# Patient Record
Sex: Female | Born: 1960 | Race: White | Hispanic: No | Marital: Married | Smoking: Former smoker
Health system: Southern US, Community
[De-identification: ages and names within clinical notes are randomized; demographics above are authoritative.]

## PROBLEM LIST (undated history)

## (undated) DIAGNOSIS — R569 Unspecified convulsions: Secondary | ICD-10-CM

## (undated) DIAGNOSIS — E7800 Pure hypercholesterolemia, unspecified: Secondary | ICD-10-CM

## (undated) DIAGNOSIS — Z8489 Family history of other specified conditions: Secondary | ICD-10-CM

## (undated) DIAGNOSIS — Z789 Other specified health status: Secondary | ICD-10-CM

## (undated) DIAGNOSIS — S83209A Unspecified tear of unspecified meniscus, current injury, unspecified knee, initial encounter: Secondary | ICD-10-CM

## (undated) DIAGNOSIS — E559 Vitamin D deficiency, unspecified: Secondary | ICD-10-CM

## (undated) HISTORY — PX: ABDOMINAL HYSTERECTOMY: SHX81

## (undated) HISTORY — DX: Pure hypercholesterolemia, unspecified: E78.00

## (undated) HISTORY — DX: Unspecified convulsions: R56.9

## (undated) HISTORY — PX: KNEE ARTHROSCOPY: SUR90

## (undated) HISTORY — PX: TUBAL LIGATION: SHX77

## (undated) HISTORY — PX: OTHER SURGICAL HISTORY: SHX169

---
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
## 2016-04-29 HISTORY — PX: OTHER SURGICAL HISTORY: SHX169

## 2017-04-29 HISTORY — PX: OTHER SURGICAL HISTORY: SHX169

## 2018-11-26 ENCOUNTER — Encounter: Payer: Self-pay | Admitting: Osteopathic Medicine

## 2018-11-26 ENCOUNTER — Other Ambulatory Visit: Payer: Self-pay

## 2018-11-26 ENCOUNTER — Ambulatory Visit (INDEPENDENT_AMBULATORY_CARE_PROVIDER_SITE_OTHER): Payer: No Typology Code available for payment source | Admitting: Osteopathic Medicine

## 2018-11-26 VITALS — BP 122/78 | HR 94 | Ht 64.0 in | Wt 209.0 lb

## 2018-11-26 DIAGNOSIS — Z860100 Personal history of colon polyps, unspecified: Secondary | ICD-10-CM

## 2018-11-26 DIAGNOSIS — E782 Mixed hyperlipidemia: Secondary | ICD-10-CM | POA: Diagnosis not present

## 2018-11-26 DIAGNOSIS — G40909 Epilepsy, unspecified, not intractable, without status epilepticus: Secondary | ICD-10-CM

## 2018-11-26 DIAGNOSIS — E559 Vitamin D deficiency, unspecified: Secondary | ICD-10-CM

## 2018-11-26 DIAGNOSIS — M25561 Pain in right knee: Secondary | ICD-10-CM

## 2018-11-26 DIAGNOSIS — Z90710 Acquired absence of both cervix and uterus: Secondary | ICD-10-CM | POA: Diagnosis not present

## 2018-11-26 DIAGNOSIS — M25562 Pain in left knee: Secondary | ICD-10-CM

## 2018-11-26 DIAGNOSIS — Z803 Family history of malignant neoplasm of breast: Secondary | ICD-10-CM

## 2018-11-26 MED ORDER — SIMVASTATIN 10 MG PO TABS: 10.0000 mg | ORAL_TABLET | Freq: Every day | ORAL | 3 refills | Status: DC

## 2018-11-26 NOTE — Patient Instructions (Signed)
Weight loss: important things to remember  It is hard work! You will have setbacks, but don't get discouraged. The goal is not short-term success, it is long-term health.   Looking at the numbers is important to track your progress and set goals, but how you are feeling and your overall health are the most important things! BMI and pounds and calories and miles logged aren't everything - they are tools to help [place] reach your goals.  You can do this!!!   Things to remember for exercise for weight loss:   Please note - I am not a certified personal trainer. I can present you with ideas and general workout goals, but an exercise program is largely up to you. Find something you can stick with, and something you enjoy!   As you progress in your exercise regimen think about gradually increasing the following, week by week:   intensity (how strenuous is your workout)  frequency (how often you are exercising)  duration (how many minutes at a time you are exercising)  Walking for 20 minutes a day is certainly better than nothing, but more strenuous exercise will develop better cardiovascular fitness.   interval training (high-intensity alternating with low-intensity, think walk/jog rather than just walk)  muscle strengthening exercises (weight lifting, calisthenics, yoga) - this also helps prevent osteoporosis!   Things to remember for diet changes for weight loss:   Please note - I am not a certified dietician. I can present you with ideas and general diet goals, but a meal plan is largely up to you. I am happy to refer you to a dietician who can give you a detailed meal plan.  Apps/logs are crucial to track how you're eating! It's not realistic to be logging everything you eat forever, but when you're starting a healthy eating lifestyle it's very helpful, and checking in with logs now and then helps you stick to your program!   Calorie restriction with the goal weight loss of no more than one  to one and a half pounds per week.   Increase lean protein such as chicken, fish, [place].   Decrease fatty foods such as dairy, butter.   Decrease sugary foods. Avoid sugary drinks such as soda or juice.  Increase fiber found in fruit and vegetables.

## 2018-11-26 NOTE — Progress Notes (Signed)
HPI: Kelsey Matthews is a 58 y.o. female who  has no past medical history on file.  she presents to [facility] today, 11/26/18,  for chief complaint of: New to establish care  High cholesterol History of epilepsy  Very pleasant new patient here to establish care.  I see both of her parents as well.  Family recently moved down here from [place], patient reports adjusting pretty well and no major concerns.  Patient is married, has 2 children.  Has been about a year since any blood work or medication refills, requesting cholesterol check as well as vitamin D for history of deficiency.  She is taking simvastatin and Depakote and doing well on these medications for many years.  Status post hysterectomy for benign disease.  Family history of breast cancer, patient's mammograms have always been normal.  History of epilepsy, no seizures for many years.  Has been stable on Depakote 500 mg twice daily, requests referral to local neurologist for follow-up.  Postmenopausal, occasional hot flashes for the past 5 years, nothing that she feels the need to be on hormone replacement therapy for.  Weight gain is a bit of a concern for her, especially after menopause.  She has been working on diet.  Exercise is difficult due to orthopedic issues with knee pain.     Past medical, surgical, social and family history reviewed:  Patient Active Problem List   Diagnosis Date Noted  . Mixed hyperlipidemia 11/26/2018  . Nonintractable epilepsy without status epilepticus ([place]) 11/26/2018  . Vitamin D deficiency 11/26/2018  . History of hysterectomy 11/26/2018  . Family history of breast cancer 11/26/2018  . History of colon polyps 11/26/2018  . Pain in both knees 11/26/2018       Social History   Tobacco Use  . Smoking status: Former Research scientist (life sciences)  . Smokeless tobacco: Never Used  Substance Use Topics  . Alcohol use: Not on file    Family History  Problem Relation  Age of Onset  . Cancer Mother        breast, thyroid  . Diabetes Father   . Hypertension Father      Current medication list and allergy/intolerance information reviewed:    Current Outpatient Medications  Medication Sig Dispense Refill  . divalproex (DEPAKOTE) 500 MG DR tablet Take 500 mg by mouth 2 (two) times daily.    . simvastatin (ZOCOR) 10 MG tablet Take 1 tablet (10 mg total) by mouth daily. 90 tablet 3   No current facility-administered medications for this visit.     Allergies  Allergen Reactions  . Codeine       Review of Systems:  Constitutional:  No  fever, no chills, No recent illness, +unintentional weight changes. No significant fatigue.   HEENT: No  headache, no vision change, no hearing change, No sore throat, No  sinus pressure  Cardiac: No  chest pain, No  pressure, No palpitations, No  Orthopnea  Respiratory:  No  shortness of breath. No  Cough  Gastrointestinal: No  abdominal pain, No  nausea, No  vomiting,  No  blood in stool, No  diarrhea, No  constipation   Musculoskeletal: No new myalgia/arthralgia  Skin: No  Rash, No other wounds/concerning lesions  Genitourinary: No  incontinence, No  abnormal genital bleeding, No abnormal genital discharge  Hem/Onc: No  easy bruising/bleeding, No  abnormal lymph node  Endocrine: No cold intolerance,  +heat intolerance. No polyuria/polydipsia/polyphagia   Neurologic: No  weakness, No  dizziness,  No  slurred speech/focal weakness/facial droop  Psychiatric: No  concerns with depression, No  concerns with anxiety, No sleep problems, No mood problems  Exam:  BP 122/78   Pulse 94   Ht 5\' 4"  (1.626 m)   Wt 209 lb (94.8 kg)   BMI 35.87 kg/m   Constitutional: VS see above. General Appearance: alert, well-developed, well-nourished, NAD  Eyes: Normal lids and conjunctive, non-icteric sclera  Neck: No masses, trachea midline. No thyroid enlargement. No tenderness/mass appreciated. No  lymphadenopathy  Respiratory: Normal respiratory effort. no wheeze, no rhonchi, no rales  Cardiovascular: S1/S2 normal, no murmur, no rub/gallop auscultated. RRR. No lower extremity edema.  Gastrointestinal: Nontender, no masses. No hepatomegaly, no splenomegaly. No hernia appreciated. Bowel sounds normal. Rectal exam deferred.   Musculoskeletal: Gait normal. No clubbing/cyanosis of digits.   Neurological: Normal balance/coordination. No tremor. No cranial nerve deficit on limited exam. Motor and sensation intact and symmetric. Cerebellar reflexes intact.   Skin: warm, dry, intact. No rash/ulcer. No concerning nevi or subq nodules on limited exam.    Psychiatric: Normal judgment/insight. Normal mood and affect. Oriented x3.    No results found for this or any previous visit (from the past 72 hour(s)).  No results found.        ASSESSMENT/PLAN: The primary encounter diagnosis was Mixed hyperlipidemia. Diagnoses of Nonintractable epilepsy without status epilepticus, unspecified epilepsy type ([place]), Vitamin D deficiency, History of hysterectomy, Family history of breast cancer, History of colon polyps, and Pain in both knees, unspecified chronicity were also pertinent to this visit.   Orders Placed This Encounter  Procedures  . CBC  . COMPLETE METABOLIC PANEL WITH GFR  . Lipid panel  . TSH  . Valproic Acid level  . Ambulatory referral to Neurology    Meds ordered this encounter  Medications  . simvastatin (ZOCOR) 10 MG tablet    Sig: Take 1 tablet (10 mg total) by mouth daily.    Dispense:  90 tablet    Refill:  3    Patient Instructions  Weight loss: important things to remember  It is hard work! You will have setbacks, but don't get discouraged. The goal is not short-term success, it is long-term health.   Looking at the numbers is important to track your progress and set goals, but how you are feeling and your overall health are the most important things! BMI and  pounds and calories and miles logged aren't everything - they are tools to help [place] reach your goals.  You can do this!!!   Things to remember for exercise for weight loss:   Please note - I am not a certified personal trainer. I can present you with ideas and general workout goals, but an exercise program is largely up to you. Find something you can stick with, and something you enjoy!   As you progress in your exercise regimen think about gradually increasing the following, week by week:   intensity (how strenuous is your workout)  frequency (how often you are exercising)  duration (how many minutes at a time you are exercising)  Walking for 20 minutes a day is certainly better than nothing, but more strenuous exercise will develop better cardiovascular fitness.   interval training (high-intensity alternating with low-intensity, think walk/jog rather than just walk)  muscle strengthening exercises (weight lifting, calisthenics, yoga) - this also helps prevent osteoporosis!   Things to remember for diet changes for weight loss:   Please note - I am not a certified dietician.  I can present you with ideas and general diet goals, but a meal plan is largely up to you. I am happy to refer you to a dietician who can give you a detailed meal plan.  Apps/logs are crucial to track how you're eating! It's not realistic to be logging everything you eat forever, but when you're starting a healthy eating lifestyle it's very helpful, and checking in with logs now and then helps you stick to your program!   Calorie restriction with the goal weight loss of no more than one to one and a half pounds per week.   Increase lean protein such as chicken, fish, [place].   Decrease fatty foods such as dairy, butter.   Decrease sugary foods. Avoid sugary drinks such as soda or juice.  Increase fiber found in fruit and vegetables.         Visit summary with medication list and pertinent instructions  was printed for patient to review. All questions at time of visit were answered - patient instructed to contact office with any additional concerns or updates. ER/RTC precautions were reviewed with the patient.     Please note: voice recognition software was used to produce this document, and typos may escape review. Please contact Dr. Sheppard Coil for any needed clarifications.     Follow-up plan: Return in about 6 months (around 05/29/2019) for [place] (call week prior to visit for lab orders).

## 2018-12-04 ENCOUNTER — Telehealth: Payer: Self-pay

## 2018-12-04 NOTE — Telephone Encounter (Signed)
I called pt and left her a VM stating she could call the number on the back of her insurance card to see if our office is in network

## 2018-12-04 NOTE — Telephone Encounter (Signed)
Pt left a vm msg - as per pt, she has new insurance coverage. Wants to make sure that plan is accepted at the clinic. Pls contact pt for verification of new insurance. Thanks.

## 2019-01-09 LAB — CBC
HCT: 39.5 % (ref 35.0–45.0)
Hemoglobin: 12.9 g/dL (ref 11.7–15.5)
MCH: 28.4 pg (ref 27.0–33.0)
MCHC: 32.7 g/dL (ref 32.0–36.0)
MCV: 86.8 fL (ref 80.0–100.0)
MPV: 10.9 fL (ref 7.5–12.5)
Platelets: 281 10*3/uL (ref 140–400)
RBC: 4.55 10*6/uL (ref 3.80–5.10)
RDW: 14.3 % (ref 11.0–15.0)
WBC: 6.3 10*3/uL (ref 3.8–10.8)

## 2019-01-09 LAB — LIPID PANEL
Cholesterol: 198 mg/dL (ref <200)
HDL: 43 mg/dL — ABNORMAL LOW (ref >=50)
LDL Cholesterol (Calc): 123 mg/dL (calc) — ABNORMAL HIGH
Non-HDL Cholesterol (Calc): 155 mg/dL (calc) — ABNORMAL HIGH (ref <130)
Total CHOL/HDL Ratio: 4.6 (calc) (ref <5.0)
Triglycerides: 198 mg/dL — ABNORMAL HIGH (ref <150)

## 2019-01-09 LAB — COMPLETE METABOLIC PANEL WITH GFR
AG Ratio: 1.8 (calc) (ref 1.0–2.5)
ALT: 12 U/L (ref 6–29)
AST: 10 U/L (ref 10–35)
Albumin: 4.4 g/dL (ref 3.6–5.1)
Alkaline phosphatase (APISO): 72 U/L (ref 37–153)
BUN: 15 mg/dL (ref 7–25)
CO2: 26 mmol/L (ref 20–32)
Calcium: 10.3 mg/dL (ref 8.6–10.4)
Chloride: 99 mmol/L (ref 98–110)
Creat: 0.77 mg/dL (ref 0.50–1.05)
GFR, Est African American: 100 mL/min/{1.73_m2} (ref >=60)
GFR, Est Non African American: 86 mL/min/{1.73_m2} (ref >=60)
Globulin: 2.4 g/dL (calc) (ref 1.9–3.7)
Glucose, Bld: 144 mg/dL — ABNORMAL HIGH (ref 65–99)
Potassium: 5 mmol/L (ref 3.5–5.3)
Sodium: 137 mmol/L (ref 135–146)
Total Bilirubin: 0.4 mg/dL (ref 0.2–1.2)
Total Protein: 6.8 g/dL (ref 6.1–8.1)

## 2019-01-09 LAB — TSH: TSH: 1.74 mIU/L (ref 0.40–4.50)

## 2019-01-09 LAB — VALPROIC ACID LEVEL: Valproic Acid Lvl: 119.6 mg/L — ABNORMAL HIGH (ref 50.0–100.0)

## 2019-01-12 ENCOUNTER — Telehealth: Payer: Self-pay

## 2019-01-12 NOTE — Telephone Encounter (Signed)
Pt called requesting another Neurologist office. As per pt, Neurology office she was referred to is out of network. Pt did not specify a specific location. Thanks.

## 2019-01-13 NOTE — Telephone Encounter (Signed)
Pt called with a Neurologist office that is within her insurance network. Pt is requesting the referral be sent to Dr. Metta Clines located in [place], [phone]. Thanks.

## 2019-01-14 NOTE — Telephone Encounter (Signed)
Done

## 2019-01-15 ENCOUNTER — Other Ambulatory Visit: Payer: Self-pay | Admitting: Family Medicine

## 2019-01-15 ENCOUNTER — Encounter: Payer: Self-pay | Admitting: Neurology

## 2019-01-15 DIAGNOSIS — G40909 Epilepsy, unspecified, not intractable, without status epilepticus: Secondary | ICD-10-CM

## 2019-01-15 NOTE — Progress Notes (Signed)
Patient is requesting a new referral due to insurance.

## 2019-01-25 ENCOUNTER — Telehealth: Payer: Self-pay

## 2019-01-25 DIAGNOSIS — M25569 Pain in unspecified knee: Secondary | ICD-10-CM

## 2019-01-25 NOTE — Telephone Encounter (Signed)
Pt called - requesting an appt with Sports Medicine. As per pt, knee pain is getting worse. Having trouble moving around. Pt mentioned due to insurance she needs a referral for Sports Medicine before having her appt that way visit is covered by insurance. She has an appt scheduled tomorrow w/Dr. Jacklynn Bue. Pls advise, thanks.

## 2019-01-26 ENCOUNTER — Ambulatory Visit (INDEPENDENT_AMBULATORY_CARE_PROVIDER_SITE_OTHER): Payer: No Typology Code available for payment source | Admitting: Sports Medicine

## 2019-01-26 ENCOUNTER — Other Ambulatory Visit: Payer: Self-pay

## 2019-01-26 ENCOUNTER — Encounter: Payer: Self-pay | Admitting: Sports Medicine

## 2019-01-26 ENCOUNTER — Ambulatory Visit (INDEPENDENT_AMBULATORY_CARE_PROVIDER_SITE_OTHER): Payer: No Typology Code available for payment source

## 2019-01-26 DIAGNOSIS — M25561 Pain in right knee: Secondary | ICD-10-CM

## 2019-01-26 NOTE — Telephone Encounter (Signed)
Pt advised.

## 2019-01-26 NOTE — Telephone Encounter (Signed)
Referral to Dr. Dianah Field ordered

## 2019-01-26 NOTE — Progress Notes (Signed)
Subjective:    I'm seeing this patient as a consultation for: Dr. Emeterio Reeve  CC: Right knee pain  HPI: This is a very pleasant 58 year old female, for the past couple weeks she is had increasing pain in her right knee, with swelling, pain at the medial joint line worse with flexion, prolonged weightbearing, no mechanical symptoms, no buckling, no trauma.  I reviewed the past medical history, family history, social history, surgical history, and allergies today and no changes were needed.  Please see the problem list section below in epic for further details.  Past Medical History: Past Medical History:  Diagnosis Date  . No pertinent past medical history    Past Surgical History: Past Surgical History:  Procedure Laterality Date  . KNEE ARTHROSCOPY Left    Social History: Social History   Socioeconomic History  . Marital status: Married    Spouse name: Not on file  . Number of children: Not on file  . Years of education: Not on file  . Highest education level: Not on file  Occupational History  . Not on file  Social Needs  . Financial resource strain: Not on file  . Food insecurity    Worry: Not on file    Inability: Not on file  . Transportation needs    Medical: Not on file    Non-medical: Not on file  Tobacco Use  . Smoking status: Former Research scientist (life sciences)  . Smokeless tobacco: Never Used  Substance and Sexual Activity  . Alcohol use: Not on file  . Drug use: Not on file  . Sexual activity: Not on file  Lifestyle  . Physical activity    Days per week: Not on file    Minutes per session: Not on file  . Stress: Not on file  Relationships  . Social Herbalist on phone: Not on file    Gets together: Not on file    Attends religious service: Not on file    Active member of club or organization: Not on file    Attends meetings of clubs or organizations: Not on file    Relationship status: Not on file  Other Topics Concern  . Not on file  Social  History Narrative  . Not on file   Family History: Family History  Problem Relation Age of Onset  . Cancer Mother        breast, thyroid  . Diabetes Father   . Hypertension Father    Allergies: Allergies  Allergen Reactions  . Codeine    Medications: See med rec.  Review of Systems: No headache, visual changes, nausea, vomiting, diarrhea, constipation, dizziness, abdominal pain, skin rash, fevers, chills, night sweats, weight loss, swollen lymph nodes, body aches, joint swelling, muscle aches, chest pain, shortness of breath, mood changes, visual or auditory hallucinations.   Objective:   General: Well Developed, well nourished, and in no acute distress.  Neuro:  Extra-ocular muscles intact, able to move all 4 extremities, sensation grossly intact.  Deep tendon reflexes tested were normal. Psych: Alert and oriented, mood congruent with affect. ENT:  Ears and nose appear unremarkable.  Hearing grossly normal. Neck: Unremarkable overall appearance, trachea midline.  No visible thyroid enlargement. Eyes: Conjunctivae and lids appear unremarkable.  Pupils equal and round. Skin: Warm and dry, no rashes noted.  Cardiovascular: Pulses palpable, no extremity edema. Right knee: Swollen, visible effusion and palpable fluid wave. Tender at the medial joint line. ROM normal in flexion and extension and lower  leg rotation. Ligaments with solid consistent endpoints including ACL, PCL, LCL, MCL. Negative Mcmurray's and provocative meniscal tests. Non painful patellar compression. Patellar and quadriceps tendons unremarkable. Hamstring and quadriceps strength is normal.  Procedure: Real-time Ultrasound Guided  aspiration/injection of right knee Device: GE Logiq E  Verbal informed consent obtained.  Time-out conducted.  Noted no overlying erythema, induration, or other signs of local infection.  Skin prepped in a sterile fashion.  Local anesthesia: Topical Ethyl chloride.  With sterile  technique and under real time ultrasound guidance:  Using an 18-gauge needle aspirated approximately 14 cc of clear, straw-colored fluid, syringe switched and 1 cc Kenalog 40, 2 cc lidocaine, 2 cc bupivacaine injected easily Completed without difficulty  Pain immediately resolved suggesting accurate placement of the medication.  Advised to call if fevers/chills, erythema, induration, drainage, or persistent bleeding.  Images permanently stored and available for review in the ultrasound unit.  Impression: Technically successful ultrasound guided injection.  Impression and Recommendations:   This case required medical decision making of moderate complexity.  Right knee pain With an effusion, suspect meniscal tear and osteoarthritis. X-rays, aspiration and injection, physical therapy, return to see me in 6 weeks.   ___________________________________________ Gwen Her. Dianah Field, M.D., ABFM., CAQSM. Primary Care and Sports Medicine [facility]  Adjunct Professor of [place] of [facility] of Medicine

## 2019-01-26 NOTE — Assessment & Plan Note (Signed)
With an effusion, suspect meniscal tear and osteoarthritis. X-rays, aspiration and injection, physical therapy, return to see me in 6 weeks.

## 2019-02-01 ENCOUNTER — Ambulatory Visit: Payer: No Typology Code available for payment source | Admitting: Physical Therapy

## 2019-02-25 ENCOUNTER — Other Ambulatory Visit: Payer: Self-pay

## 2019-02-25 ENCOUNTER — Ambulatory Visit (INDEPENDENT_AMBULATORY_CARE_PROVIDER_SITE_OTHER): Payer: No Typology Code available for payment source | Admitting: Sports Medicine

## 2019-02-25 ENCOUNTER — Encounter: Payer: Self-pay | Admitting: Sports Medicine

## 2019-02-25 DIAGNOSIS — M25561 Pain in right knee: Secondary | ICD-10-CM

## 2019-02-25 NOTE — Assessment & Plan Note (Addendum)
Persistent right knee pain in spite of aspiration and injection, mostly at the medial joint line. She does have a history of a left partial meniscectomy. We suspect meniscal tear of the right knee, she has failed greater than 6 weeks of conservative measures, x-rays were unrevealing. Proceeding with MRI, she will use over-the-counter ibuprofen, return to see me to go over MRI results. 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

## 2019-02-25 NOTE — Progress Notes (Addendum)
Subjective:    CC: Follow-up  HPI: Kelsey Matthews returns, she is a pleasant 58 year old female here with right knee pain, only minimal arthritis on x-rays, we did an aspiration and injection, she still has significant discomfort at the medial joint line.  I reviewed the past medical history, family history, social history, surgical history, and allergies today and no changes were needed.  Please see the problem list section below in epic for further details.  Past Medical History: Past Medical History:  Diagnosis Date  . High cholesterol   . Seizure [facility])    Past Surgical History: Past Surgical History:  Procedure Laterality Date  . KNEE ARTHROSCOPY Left    Social History: Social History   Socioeconomic History  . Marital status: Married    Spouse name: Not on file  . Number of children: Not on file  . Years of education: Not on file  . Highest education level: Not on file  Occupational History  . Not on file  Social Needs  . Financial resource strain: Not on file  . Food insecurity    Worry: Not on file    Inability: Not on file  . Transportation needs    Medical: Not on file    Non-medical: Not on file  Tobacco Use  . Smoking status: Former Research scientist (life sciences)  . Smokeless tobacco: Never Used  Substance and Sexual Activity  . Alcohol use: Yes    Comment: Monthly  . Drug use: Never  . Sexual activity: Yes    Partners: Male  Lifestyle  . Physical activity    Days per week: Not on file    Minutes per session: Not on file  . Stress: Not on file  Relationships  . Social Herbalist on phone: Not on file    Gets together: Not on file    Attends religious service: Not on file    Active member of club or organization: Not on file    Attends meetings of clubs or organizations: Not on file    Relationship status: Not on file  Other Topics Concern  . Not on file  Social History Narrative   Left handed   Lives with husband in two story home   Completed HS. Cosmetology  school   Family History: Family History  Problem Relation Age of Onset  . Cancer Mother        breast, thyroid  . Heart disease Mother   . Diabetes Father   . Hypertension Father   . Bowel Disease Father    Allergies: Allergies  Allergen Reactions  . Codeine    Medications: See med rec.  Review of Systems: No fevers, chills, night sweats, weight loss, chest pain, or shortness of breath.   Objective:    General: Well Developed, well nourished, and in no acute distress.  Neuro: Alert and oriented x3, extra-ocular muscles intact, sensation grossly intact.  HEENT: Normocephalic, atraumatic, pupils equal round reactive to light, neck supple, no masses, no lymphadenopathy, thyroid nonpalpable.  Skin: Warm and dry, no rashes. Cardiac: Regular rate and rhythm, no murmurs rubs or gallops, no lower extremity edema.  Respiratory: Clear to auscultation bilaterally. Not using accessory muscles, speaking in full sentences. Right knee: Severe tenderness at the medial joint line, otherwise knee is normal to inspection without erythema or obvious bony abnormalities. No visible or palpable effusion. ROM normal in flexion and extension and lower leg rotation. Ligaments with solid consistent endpoints including ACL, PCL, LCL, MCL. Negative Mcmurray's and provocative  meniscal tests. Non painful patellar compression. Patellar and quadriceps tendons unremarkable. Hamstring and quadriceps strength is normal.  Impression and Recommendations:    Right knee pain Persistent right knee pain in spite of aspiration and injection, mostly at the medial joint line. She does have a history of a left partial meniscectomy. We suspect meniscal tear of the right knee, she has failed greater than 6 weeks of conservative measures, x-rays were unrevealing. Proceeding with MRI, she will use over-the-counter ibuprofen, return to see me to go over MRI results. 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    ___________________________________________ Gwen Her. Dianah Field, M.D., ABFM., CAQSM. Primary Care and Sports Medicine [place] [facility]  Adjunct Professor of [place] [employer] [facility] of Medicine

## 2019-03-02 ENCOUNTER — Encounter: Payer: Self-pay | Admitting: Neurology

## 2019-03-02 ENCOUNTER — Ambulatory Visit (INDEPENDENT_AMBULATORY_CARE_PROVIDER_SITE_OTHER): Payer: No Typology Code available for payment source

## 2019-03-02 ENCOUNTER — Other Ambulatory Visit (INDEPENDENT_AMBULATORY_CARE_PROVIDER_SITE_OTHER): Payer: No Typology Code available for payment source

## 2019-03-02 ENCOUNTER — Other Ambulatory Visit: Payer: Self-pay

## 2019-03-02 ENCOUNTER — Ambulatory Visit: Payer: No Typology Code available for payment source | Admitting: Neurology

## 2019-03-02 VITALS — BP 148/86 | HR 93 | Ht 64.0 in | Wt 212.1 lb

## 2019-03-02 DIAGNOSIS — G40309 Generalized idiopathic epilepsy and epileptic syndromes, not intractable, without status epilepticus: Secondary | ICD-10-CM

## 2019-03-02 DIAGNOSIS — M25561 Pain in right knee: Secondary | ICD-10-CM | POA: Diagnosis not present

## 2019-03-02 DIAGNOSIS — Z79899 Other long term (current) drug therapy: Secondary | ICD-10-CM

## 2019-03-02 DIAGNOSIS — D329 Benign neoplasm of meninges, unspecified: Secondary | ICD-10-CM

## 2019-03-02 MED ORDER — DIVALPROEX SODIUM 500 MG PO DR TAB: 500.0000 mg | DELAYED_RELEASE_TABLET | Freq: Two times a day (BID) | ORAL | 3 refills | Status: AC

## 2019-03-02 NOTE — Progress Notes (Addendum)
NEUROLOGY CONSULTATION NOTE  Kelsey Matthews MRN: [id number] DOB: 07-01-1960  Referring provider: Dr. Beatrice Lecher Primary care provider: Dr. Emeterio Reeve  Reason for consult:  Establish care for seizures  Dear Dr Madilyn Fireman:  Thank you for your kind referral of Kelsey Matthews for consultation of the above symptoms. Although her history is well known to you, please allow me to reiterate it for the purpose of our medical record. She is alone in the office today. Records and images were personally reviewed where available.   HISTORY OF PRESENT ILLNESS: This is a very pleasant 58 year old left-handed woman with a history of hyperlipidemia and epilepsy, presenting to establish care. She moved to [facility] last May from [place], records from her prior neurologist Dr. Lemar Livings are unavailable for review. She reports seizure started at age 77 when she would have jerky movements in her arms. At age 79, she had her one and only generalized tonic-clonic seizure. In hindsight, her father mentioned that as a child she was told she would stare off. She was started on Depakote, briefly on Phenytoin and clonazepam when she had the GTC, but has been on Depakote since childhood. She has been on Depakote DR 500mg  BID for at least 7 years, when she was on half the dose previously, she was having a different kind of feeling. She denies any further jerking or staring spells for many years. When she was in her 42s, she had a transient episode of numbness on the right half of her face. She was found to have "growths" (?meningioma) pressing on the optic nerve. She reports having serial brain imaging that were stable, last MRI she recalls was in 2017. She denies any olfactory/gustatory hallucinations, rising epigastric sensation, headaches, dizziness, diplopia, dysarthria/dysphagia, focal numbness/tingling/weakness, bowel/bladder dysfunction. Memory is good. She denies any side effects on Depakote. She has been told  that her vitamin D levels have been historically low, she takes a daily supplement. Her last bone density scan was several years ago. She has right knee pain with plans for surgery. Her last Depakote level on 01/08/2019 was 119.6 but this was not a trough level.  Epilepsy Risk Factors:  A paternal cousin had seizures. She had a normal birth and early development.  There is no history of febrile convulsions, CNS infections such as meningitis/encephalitis, significant traumatic brain injury, neurosurgical procedures.  Prior AEDs: Phenytoin, clonazepam  Laboratory Data:  Lab Results  Component Value Date   VALPROATE 119.6 (H) 01/08/2019   Diagnostic Data: No EEGs or MRIs available for review   PAST MEDICAL HISTORY: Past Medical History:  Diagnosis Date   High cholesterol    Seizure ([place])     PAST SURGICAL HISTORY: Past Surgical History:  Procedure Laterality Date   KNEE ARTHROSCOPY Left     MEDICATIONS: Current Outpatient Medications on File Prior to Visit  Medication Sig Dispense Refill   Cholecalciferol (VITAMIN D3) 50 MCG (2000 UT) TABS Take by mouth daily.     divalproex (DEPAKOTE) 500 MG DR tablet Take 500 mg by mouth 2 (two) times daily.     ibuprofen (ADVIL) 200 MG tablet Take 200 mg by mouth every 6 (six) hours as needed.     simvastatin (ZOCOR) 10 MG tablet Take 1 tablet (10 mg total) by mouth daily. 90 tablet 3   No current facility-administered medications on file prior to visit.     ALLERGIES: Allergies  Allergen Reactions   Codeine     FAMILY HISTORY: Family History  Problem  Relation Age of Onset   Cancer Mother        breast, thyroid   Heart disease Mother    Diabetes Father    Hypertension Father    Bowel Disease Father     SOCIAL HISTORY: Social History   Socioeconomic History   Marital status: Married    Spouse name: Not on file   Number of children: Not on file   Years of education: Not on file   Highest education level:  Not on file  Occupational History   Not on file  Social Needs   Financial resource strain: Not on file   Food insecurity    Worry: Not on file    Inability: Not on file   Transportation needs    Medical: Not on file    Non-medical: Not on file  Tobacco Use   Smoking status: Former Smoker   Smokeless tobacco: Never Used  Substance and Sexual Activity   Alcohol use: Yes    Comment: Monthly   Drug use: Never   Sexual activity: Yes    Partners: Male  Lifestyle   Physical activity    Days per week: Not on file    Minutes per session: Not on file   Stress: Not on file  Relationships   Social connections    Talks on phone: Not on file    Gets together: Not on file    Attends religious service: Not on file    Active member of club or organization: Not on file    Attends meetings of clubs or organizations: Not on file    Relationship status: Not on file   Intimate partner violence    Fear of current or ex partner: Not on file    Emotionally abused: Not on file    Physically abused: Not on file    Forced sexual activity: Not on file  Other Topics Concern   Not on file  Social History Narrative   Left handed   Lives with husband in two story home   Completed HS. Cosmetology school    REVIEW OF SYSTEMS: Constitutional: No fevers, chills, or sweats, no generalized fatigue, change in appetite Eyes: No visual changes, double vision, eye pain Ear, nose and throat: No hearing loss, ear pain, nasal congestion, sore throat Cardiovascular: No chest pain, palpitations Respiratory:  No shortness of breath at rest or with exertion, wheezes GastrointestinaI: No nausea, vomiting, diarrhea, abdominal pain, fecal incontinence Genitourinary:  No dysuria, urinary retention or frequency Musculoskeletal:  +occl neck pain, no back pain Integumentary: No rash, pruritus, skin lesions Neurological: as above Psychiatric: No depression, insomnia, anxiety Endocrine: No palpitations,  fatigue, diaphoresis, mood swings, change in appetite, change in weight, increased thirst Hematologic/Lymphatic:  No anemia, purpura, petechiae. Allergic/Immunologic: no itchy/runny eyes, nasal congestion, recent allergic reactions, rashes  PHYSICAL EXAM: Vitals:   03/02/19 1025  BP: (!) 148/86  Pulse: 93  SpO2: 97%   General: No acute distress Head:  Normocephalic/atraumatic Skin/Extremities: No rash, no edema Neurological Exam: Mental status: alert and oriented to person, place, and time, no dysarthria or aphasia, Fund of knowledge is appropriate.  Recent and remote memory are intact. 3/3 delayed recall.  Attention and concentration are normal.    Able to name objects and repeat phrases. Cranial nerves: CN I: not tested CN II: pupils equal, round and reactive to light, visual fields intact CN III, IV, VI:  full range of motion, no nystagmus, no ptosis CN V: facial sensation intact CN VII:  upper and lower face symmetric CN VIII: hearing intact to conversation CN IX, X: gag intact, uvula midline CN XI: sternocleidomastoid and trapezius muscles intact CN XII: tongue midline Bulk & Tone: normal, no fasciculations. Motor: 5/5 throughout with no pronator drift. Sensation: intact to light touch, cold. Romberg test negative Deep Tendon Reflexes: +2 throughout (deferred on right knee due to pain). Plantar responses: downgoing bilaterally Cerebellar: no incoordination on finger to nose testing Gait: narrow-based and steady, able to tandem walk adequately. 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 Last GTC at age 39. No side effects on Depakote, refills sent. We discussed potential effects of Depakote on bone metabolism, check vitamin D level and bone density scan. She reports interval brain imaging done in  [place] for "growths" in the lining of her brain (suggestive of meningioma), we discussed repeating MRI brain with and without contrast however she would like to hold off for now. Records from CT will be requested for review and if necessary, repeat imaging will be done.  [place] driving laws were discussed with the patient, and she knows to stop driving after a seizure, until 6 months seizure-free. Follow-up in 1 year, she knows to call for any changes.   Thank you for allowing me to participate in the care of this patient. Please do not hesitate to call for any questions or concerns.   Ellouise Newer, M.D.  CC: Dr. Madilyn Fireman, Dr. Sheppard Coil  ADDENDUM 03/03/2019: Records from prior neurologist reviewed: In 12/2009 she had right facial numbness. 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 Neurosurgery recommended surveillance annually.  EEG in 04/2010 was unremarkable Depakote level 05/2015: 110 MRI brain requested on 04/2017 visit but no results sent

## 2019-03-02 NOTE — Patient Instructions (Addendum)
1. Bloodwork for vitamin D level  2. Schedule bone density scan  3. Records from [facility] will be requested and if needed, we will schedule MRI brain with and without contrast  4. Continue Depakote DR 500mg  twice a day  5. Follow-up in 1 year, call for any changes  Seizure Precautions: 1. If medication has been prescribed for you to prevent seizures, take it exactly as directed.  Do not stop taking the medicine without talking to your doctor first, even if you have not had a seizure in a long time.   2. Avoid activities in which a seizure would cause danger to yourself or to others.  Don't operate dangerous machinery, swim alone, or climb in high or dangerous places, such as on ladders, roofs, or girders.  Do not drive unless your doctor says you may.  3. If you have any warning that you may have a seizure, lay down in a safe place where you can't hurt yourself.    4.  No driving for 6 months from last seizure, as per [facility].   Please refer to the following link on the [place] website for more information: http://www.epilepsyfoundation.org/answerplace/Social/driving/drivingu.cfm   5.  Maintain good sleep hygiene. Avoid alcohol.  6.  Contact your doctor if you have any problems that may be related to the medicine you are taking.  7.  Call 911 and bring the patient back to the ED if:        A.  The seizure lasts longer than 5 minutes.       B.  The patient doesn't awaken shortly after the seizure  C.  The patient has new problems such as difficulty seeing, speaking or moving  D.  The patient was injured during the seizure  E.  The patient has a temperature over 102 F (39C)  F.  The patient vomited and now is having trouble breathing

## 2019-03-03 ENCOUNTER — Telehealth: Payer: Self-pay

## 2019-03-03 ENCOUNTER — Other Ambulatory Visit: Payer: Self-pay | Admitting: Neurology

## 2019-03-03 DIAGNOSIS — D329 Benign neoplasm of meninges, unspecified: Secondary | ICD-10-CM

## 2019-03-03 LAB — VITAMIN D 25 HYDROXY (VIT D DEFICIENCY, FRACTURES): Vit D, 25-Hydroxy: 23 ng/mL — ABNORMAL LOW (ref 30–100)

## 2019-03-03 MED ORDER — VITAMIN D3 1.25 MG (50000 UT) PO CAPS: ORAL_CAPSULE | ORAL | 0 refills | Status: DC

## 2019-03-03 NOTE — Telephone Encounter (Signed)
-----   Message from Cameron Sprang, MD sent at 03/03/2019  1:03 PM EST ----- Regarding: MRI Pls let her know the records we got from [facility] were an MRI brain in 2011, then Dr. Ky Barban wanted one done in 2019 but there are no results, did she have the MRI in 2019? If not, would go ahead and do MRI brain with and without contrast for Dx: meningioma. Thanks

## 2019-03-03 NOTE — Telephone Encounter (Signed)
-----   Message from Cameron Sprang, MD sent at 03/03/2019 12:14 PM EST ----- Pls let her know the vitamin D level is low even with the vitamin D she is taking, recommend taking vitamin D3 50,0000 units once a week for 6 weeks, I sent in the Rx. After finishing this, go back to her regular daily vitamin D supplement. thanks

## 2019-03-03 NOTE — Telephone Encounter (Signed)
-----   Message from Cameron Sprang, MD sent at 03/03/2019  1:03 PM EST ----- Regarding: MRI Pls let her know the records we got from [facility] were an MRI brain in 2011, then Dr. Ky Barban wanted one done in 2019 but there are no results, did she have the MRI in 2019? If not, would go ahead and do MRI brain with and without contrast for Dx: meningioma. Thanks

## 2019-03-03 NOTE — Telephone Encounter (Signed)
Left message for pt to return call.

## 2019-03-03 NOTE — Telephone Encounter (Signed)
Pt did not have a MRI in 2019. States that she was in the process of leaving [place] at the time and cancelled appt. New order placed, faxed to [facility] since it is closer to pts home.

## 2019-03-03 NOTE — Telephone Encounter (Signed)
Pt informed of lab results. Aware of new Rx for Vit D 50000 and to start taking weekly for 6w then start back on daily vit d supplement.

## 2019-03-04 NOTE — Addendum Note (Signed)
Addended by: Silverio Decamp on: 03/04/2019 08:23 AM   Modules accepted: Orders

## 2019-03-05 ENCOUNTER — Telehealth: Payer: Self-pay | Admitting: *Deleted

## 2019-03-05 DIAGNOSIS — M25561 Pain in right knee: Secondary | ICD-10-CM

## 2019-03-05 NOTE — Telephone Encounter (Signed)
Done

## 2019-03-05 NOTE — Telephone Encounter (Signed)
Pt called me yesterday to let us know that the ortho office we referred her to is out of her network.  I spoke with her today and she found out that [facility] is in her network so can you reorder the referral? Thanks

## 2019-03-09 ENCOUNTER — Ambulatory Visit: Payer: No Typology Code available for payment source | Admitting: Sports Medicine

## 2019-04-12 ENCOUNTER — Other Ambulatory Visit (HOSPITAL_COMMUNITY)
Admission: RE | Admit: 2019-04-12 | Discharge: 2019-04-12 | Disposition: A | Discharge status: Home / Self Care | Payer: No Typology Code available for payment source | Source: Ambulatory Visit | Attending: Orthopedic Surgery | Admitting: Orthopedic Surgery

## 2019-04-12 DIAGNOSIS — Z20828 Contact with and (suspected) exposure to other viral communicable diseases: Secondary | ICD-10-CM | POA: Diagnosis not present

## 2019-04-12 DIAGNOSIS — Z01812 Encounter for preprocedural laboratory examination: Secondary | ICD-10-CM | POA: Diagnosis not present

## 2019-04-13 ENCOUNTER — Other Ambulatory Visit: Payer: Self-pay

## 2019-04-13 ENCOUNTER — Encounter (HOSPITAL_BASED_OUTPATIENT_CLINIC_OR_DEPARTMENT_OTHER): Payer: Self-pay | Admitting: Orthopedic Surgery

## 2019-04-13 LAB — NOVEL CORONAVIRUS, NAA (HOSP ORDER, SEND-OUT TO REF LAB; TAT 18-24 HRS): SARS-CoV-2, NAA: NOT DETECTED

## 2019-04-13 NOTE — Progress Notes (Signed)
Spoke w/ via phone for pre-op interview---[facility] needs dos----    none          Lab results------lov neurology dr Delice Lesch 03-02-19 chart/epic COVID test ------04-12-2019 Arrive at -------830 am 04-15-2019 NPO after ------midnight food, clear liquids until 730 am then npo Medications to take morning of surgery -----depakote Diabetic medication -----n/a Patient Special Instructions ----- Pre-Op special Istructions ----- Patient verbalized understanding of instructions that were given at this phone interview. Patient denies shortness of breath, chest pain, fever, cough a this phone interview.

## 2019-04-15 ENCOUNTER — Ambulatory Visit (HOSPITAL_BASED_OUTPATIENT_CLINIC_OR_DEPARTMENT_OTHER): Payer: No Typology Code available for payment source | Admitting: Anesthesiology

## 2019-04-15 ENCOUNTER — Ambulatory Visit (HOSPITAL_COMMUNITY): Payer: No Typology Code available for payment source

## 2019-04-15 ENCOUNTER — Encounter (HOSPITAL_BASED_OUTPATIENT_CLINIC_OR_DEPARTMENT_OTHER)
Admission: RE | Disposition: A | Discharge status: Home / Self Care | Payer: Self-pay | Source: Home / Self Care | Attending: Orthopedic Surgery

## 2019-04-15 ENCOUNTER — Ambulatory Visit (HOSPITAL_BASED_OUTPATIENT_CLINIC_OR_DEPARTMENT_OTHER)
Admission: RE | Admit: 2019-04-15 | Discharge: 2019-04-15 | Disposition: A | Discharge status: Home / Self Care | Payer: No Typology Code available for payment source | Attending: Orthopedic Surgery | Admitting: Orthopedic Surgery

## 2019-04-15 ENCOUNTER — Encounter (HOSPITAL_BASED_OUTPATIENT_CLINIC_OR_DEPARTMENT_OTHER): Payer: Self-pay | Admitting: Orthopedic Surgery

## 2019-04-15 DIAGNOSIS — M84461A Pathological fracture, right tibia, initial encounter for fracture: Secondary | ICD-10-CM | POA: Insufficient documentation

## 2019-04-15 DIAGNOSIS — Z79899 Other long term (current) drug therapy: Secondary | ICD-10-CM | POA: Insufficient documentation

## 2019-04-15 DIAGNOSIS — X58XXXA Exposure to other specified factors, initial encounter: Secondary | ICD-10-CM | POA: Diagnosis not present

## 2019-04-15 DIAGNOSIS — Z87891 Personal history of nicotine dependence: Secondary | ICD-10-CM | POA: Insufficient documentation

## 2019-04-15 DIAGNOSIS — Z419 Encounter for procedure for purposes other than remedying health state, unspecified: Secondary | ICD-10-CM

## 2019-04-15 DIAGNOSIS — E559 Vitamin D deficiency, unspecified: Secondary | ICD-10-CM | POA: Insufficient documentation

## 2019-04-15 DIAGNOSIS — M2241 Chondromalacia patellae, right knee: Secondary | ICD-10-CM | POA: Diagnosis not present

## 2019-04-15 DIAGNOSIS — S83231A Complex tear of medial meniscus, current injury, right knee, initial encounter: Secondary | ICD-10-CM | POA: Insufficient documentation

## 2019-04-15 DIAGNOSIS — R569 Unspecified convulsions: Secondary | ICD-10-CM | POA: Insufficient documentation

## 2019-04-15 DIAGNOSIS — E7800 Pure hypercholesterolemia, unspecified: Secondary | ICD-10-CM | POA: Insufficient documentation

## 2019-04-15 DIAGNOSIS — M659 Synovitis and tenosynovitis, unspecified: Secondary | ICD-10-CM | POA: Insufficient documentation

## 2019-04-15 DIAGNOSIS — M25561 Pain in right knee: Secondary | ICD-10-CM

## 2019-04-15 HISTORY — DX: Unspecified tear of unspecified meniscus, current injury, unspecified knee, initial encounter: S83.209A

## 2019-04-15 HISTORY — DX: Family history of other specified conditions: Z84.89

## 2019-04-15 HISTORY — PX: KNEE ARTHROSCOPY WITH SUBCHONDROPLASTY: SHX6732

## 2019-04-15 HISTORY — DX: Vitamin D deficiency, unspecified: E55.9

## 2019-04-15 SURGERY — ARTHROSCOPY, KNEE, WITH SUBCHONDROPLASTY
Anesthesia: General | Site: Knee | Laterality: Right

## 2019-04-15 MED ORDER — PROPOFOL 10 MG/ML IV BOLUS
INTRAVENOUS | Status: DC | PRN
  Administered 2019-04-15: 200 mg via INTRAVENOUS

## 2019-04-15 MED ORDER — ACETAMINOPHEN 10 MG/ML IV SOLN
1000.0000 mg | Freq: Once | INTRAVENOUS | Status: DC | PRN
  Filled 2019-04-15: qty 100

## 2019-04-15 MED ORDER — CEFAZOLIN SODIUM-DEXTROSE 2-4 GM/100ML-% IV SOLN
2.0000 g | INTRAVENOUS | Status: AC
  Administered 2019-04-15: 2 g via INTRAVENOUS
  Filled 2019-04-15: qty 100

## 2019-04-15 MED ORDER — ACETAMINOPHEN 160 MG/5ML PO SOLN
1000.0000 mg | Freq: Once | ORAL | Status: DC | PRN
  Filled 2019-04-15: qty 40.6

## 2019-04-15 MED ORDER — CHLORHEXIDINE GLUCONATE 4 % EX LIQD
60.0000 mL | Freq: Once | CUTANEOUS | Status: DC
  Filled 2019-04-15: qty 118

## 2019-04-15 MED ORDER — OXYCODONE HCL 5 MG PO TABS
5.0000 mg | ORAL_TABLET | Freq: Once | ORAL | Status: AC | PRN
  Administered 2019-04-15: 5 mg via ORAL
  Filled 2019-04-15: qty 1

## 2019-04-15 MED ORDER — MIDAZOLAM HCL 2 MG/2ML IJ SOLN
INTRAMUSCULAR | Status: AC
  Filled 2019-04-15: qty 2

## 2019-04-15 MED ORDER — ACETAMINOPHEN 500 MG PO TABS
1000.0000 mg | ORAL_TABLET | Freq: Once | ORAL | Status: DC | PRN
  Filled 2019-04-15: qty 2

## 2019-04-15 MED ORDER — OXYCODONE HCL 5 MG PO TABS
ORAL_TABLET | ORAL | Status: AC
  Filled 2019-04-15: qty 1

## 2019-04-15 MED ORDER — ONDANSETRON 4 MG PO TBDP: 4.0000 mg | ORAL_TABLET | Freq: Three times a day (TID) | ORAL | 0 refills | Status: DC | PRN

## 2019-04-15 MED ORDER — PROPOFOL 10 MG/ML IV BOLUS
INTRAVENOUS | Status: AC
  Filled 2019-04-15: qty 20

## 2019-04-15 MED ORDER — FENTANYL CITRATE (PF) 100 MCG/2ML IJ SOLN
INTRAMUSCULAR | Status: AC
  Filled 2019-04-15: qty 2

## 2019-04-15 MED ORDER — FENTANYL CITRATE (PF) 100 MCG/2ML IJ SOLN
25.0000 ug | INTRAMUSCULAR | Status: DC | PRN
  Administered 2019-04-15: 14:00:00 25 ug via INTRAVENOUS
  Filled 2019-04-15: qty 1

## 2019-04-15 MED ORDER — LACTATED RINGERS IV SOLN
INTRAVENOUS | Status: DC
  Filled 2019-04-15: qty 1000

## 2019-04-15 MED ORDER — OXYCODONE HCL 5 MG/5ML PO SOLN
5.0000 mg | Freq: Once | ORAL | Status: AC | PRN
  Filled 2019-04-15: qty 5

## 2019-04-15 MED ORDER — MIDAZOLAM HCL 2 MG/2ML IJ SOLN
2.0000 mg | Freq: Once | INTRAMUSCULAR | Status: AC
  Administered 2019-04-15: 11:00:00 1 mg via INTRAVENOUS
  Filled 2019-04-15: qty 2

## 2019-04-15 MED ORDER — BUPIVACAINE HCL 0.25 % IJ SOLN
INTRAMUSCULAR | Status: DC | PRN
  Administered 2019-04-15: 6 mL

## 2019-04-15 MED ORDER — DEXAMETHASONE SODIUM PHOSPHATE 10 MG/ML IJ SOLN
INTRAMUSCULAR | Status: DC | PRN
  Administered 2019-04-15: 5 mg via INTRAVENOUS

## 2019-04-15 MED ORDER — ONDANSETRON HCL 4 MG/2ML IJ SOLN
INTRAMUSCULAR | Status: DC | PRN
  Administered 2019-04-15: 4 mg via INTRAVENOUS

## 2019-04-15 MED ORDER — LIDOCAINE 2% (20 MG/ML) 5 ML SYRINGE
INTRAMUSCULAR | Status: AC
  Filled 2019-04-15: qty 5

## 2019-04-15 MED ORDER — FENTANYL CITRATE (PF) 100 MCG/2ML IJ SOLN
INTRAMUSCULAR | Status: DC | PRN
  Administered 2019-04-15 (×4): 25 ug via INTRAVENOUS

## 2019-04-15 MED ORDER — DEXAMETHASONE SODIUM PHOSPHATE 10 MG/ML IJ SOLN
INTRAMUSCULAR | Status: AC
  Filled 2019-04-15: qty 1

## 2019-04-15 MED ORDER — ONDANSETRON HCL 4 MG/2ML IJ SOLN
INTRAMUSCULAR | Status: AC
  Filled 2019-04-15: qty 2

## 2019-04-15 MED ORDER — CEFAZOLIN SODIUM-DEXTROSE 2-4 GM/100ML-% IV SOLN
INTRAVENOUS | Status: AC
  Filled 2019-04-15: qty 100

## 2019-04-15 MED ORDER — OXYCODONE HCL 5 MG PO TABS: 5.0000 mg | ORAL_TABLET | ORAL | 0 refills | Status: DC | PRN

## 2019-04-15 MED ORDER — MIDAZOLAM HCL 5 MG/5ML IJ SOLN
INTRAMUSCULAR | Status: DC | PRN
  Administered 2019-04-15: 2 mg via INTRAVENOUS

## 2019-04-15 MED ORDER — FENTANYL CITRATE (PF) 100 MCG/2ML IJ SOLN
100.0000 ug | Freq: Once | INTRAMUSCULAR | Status: AC
  Administered 2019-04-15: 11:00:00 50 ug via INTRAVENOUS
  Filled 2019-04-15: qty 2

## 2019-04-15 SURGICAL SUPPLY — 41 items
ABLATOR ASPIRATE 50D MULTI-PRT (SURGICAL WAND) IMPLANT
BANDAGE ELASTIC 6 VELCRO ST LF (GAUZE/BANDAGES/DRESSINGS) ×2 IMPLANT
BANDAGE ESMARK 6X9 LF (GAUZE/BANDAGES/DRESSINGS) IMPLANT
BLADE SHAVER TORPEDO 4X13 (MISCELLANEOUS) ×2 IMPLANT
BNDG ELASTIC 6X5.8 VLCR STR LF (GAUZE/BANDAGES/DRESSINGS) ×1 IMPLANT
BNDG ESMARK 6X9 LF (GAUZE/BANDAGES/DRESSINGS)
COVER MAYO STAND STRL (DRAPES) ×1 IMPLANT
COVER WAND RF STERILE (DRAPES) ×2 IMPLANT
CUFF TOURN SGL QUICK 34 (TOURNIQUET CUFF) ×1
CUFF TRNQT CYL 34X4.125X (TOURNIQUET CUFF) ×1 IMPLANT
DRAPE ARTHROSCOPY W/POUCH 114 (DRAPES) ×2 IMPLANT
DRAPE C-ARM 42X120 X-RAY (DRAPES) ×1 IMPLANT
DRAPE U-SHAPE 47X51 STRL (DRAPES) ×2 IMPLANT
DRSG PAD ABDOMINAL 8X10 ST (GAUZE/BANDAGES/DRESSINGS) ×2 IMPLANT
DURAPREP 26ML APPLICATOR (WOUND CARE) ×2 IMPLANT
GAUZE SPONGE 4X4 12PLY STRL (GAUZE/BANDAGES/DRESSINGS) ×2 IMPLANT
GAUZE XEROFORM 1X8 LF (GAUZE/BANDAGES/DRESSINGS) ×2 IMPLANT
GLOVE BIO SURGEON STRL SZ7.5 (GLOVE) ×2 IMPLANT
GLOVE BIOGEL PI IND STRL 8 (GLOVE) ×1 IMPLANT
GLOVE BIOGEL PI INDICATOR 8 (GLOVE) ×1
GOWN STRL REUS W/TWL XL LVL3 (GOWN DISPOSABLE) IMPLANT
GRAFT FILLER BONE 5ML (Knees) ×1 IMPLANT
IV NS IRRIG 3000ML ARTHROMATIC (IV SOLUTION) ×4 IMPLANT
KIT ACCUFILL 5CC (Knees) ×1 IMPLANT
KIT KNEE SCP 414.502 (Knees) ×2 IMPLANT
KIT TURNOVER CYSTO (KITS) ×2 IMPLANT
KNEE WRAP E Z 3 GEL PACK (MISCELLANEOUS) ×2 IMPLANT
MANIFOLD NEPTUNE II (INSTRUMENTS) ×2 IMPLANT
PACK ARTHROSCOPY DSU (CUSTOM PROCEDURE TRAY) ×2 IMPLANT
PACK BASIN DAY SURGERY FS (CUSTOM PROCEDURE TRAY) ×2 IMPLANT
PAD ABD 8X10 STRL (GAUZE/BANDAGES/DRESSINGS) ×1 IMPLANT
PAD ARMBOARD 7.5X6 YLW CONV (MISCELLANEOUS) IMPLANT
PAD CAST 4YDX4 CTTN HI CHSV (CAST SUPPLIES) IMPLANT
PADDING CAST COTTON 4X4 STRL (CAST SUPPLIES) ×1
PORT APPOLLO RF 90DEGREE MULTI (SURGICAL WAND) IMPLANT
SUT ETHILON 4 0 PS 2 18 (SUTURE) ×2 IMPLANT
SYR CONTROL 10ML LL (SYRINGE) ×2 IMPLANT
TOWEL OR 17X26 10 PK STRL BLUE (TOWEL DISPOSABLE) ×2 IMPLANT
TUBE CONNECTING 12X1/4 (SUCTIONS) ×4 IMPLANT
TUBING ARTHROSCOPY IRRIG 16FT (MISCELLANEOUS) ×2 IMPLANT
WATER STERILE IRR 500ML POUR (IV SOLUTION) ×2 IMPLANT

## 2019-04-15 NOTE — Anesthesia Postprocedure Evaluation (Signed)
Anesthesia Post Note  Patient: Stepanie Massiah  Procedure(s) Performed: Right knee arthroscopicpartial medial meniscectomy with medial tibial plateau subchondroplasty (Right Knee)     Patient location during evaluation: PACU Anesthesia Type: General Level of consciousness: awake and alert and oriented Pain management: pain level controlled Vital Signs Assessment: post-procedure vital signs reviewed and stable Respiratory status: spontaneous breathing, nonlabored ventilation and respiratory function stable Cardiovascular status: blood pressure returned to baseline and stable Postop Assessment: no apparent nausea or vomiting Anesthetic complications: no    Last Vitals:  Vitals:   04/15/19 1315 04/15/19 1330  BP: 138/86 (!) 141/94  Pulse: 84 81  Resp: 15 16  Temp:    SpO2: 100% 100%    Last Pain:  Vitals:   04/15/19 1345  TempSrc:   PainSc: 7                  Jovanni Eckhart A.

## 2019-04-15 NOTE — Anesthesia Procedure Notes (Signed)
Procedure Name: LMA Insertion Date/Time: 04/15/2019 11:59 AM Performed by: Justice Rocher, CRNA Pre-anesthesia Checklist: Patient identified, Emergency Drugs available, Suction available and Patient being monitored Patient Re-evaluated:Patient Re-evaluated prior to induction Oxygen Delivery Method: Circle system utilized Preoxygenation: Pre-oxygenation with 100% oxygen Induction Type: IV induction Ventilation: Mask ventilation without difficulty LMA: LMA inserted LMA Size: 4.0 Number of attempts: 1 Airway Equipment and Method: Bite block Placement Confirmation: positive ETCO2 and breath sounds checked- equal and bilateral Tube secured with: Tape Dental Injury: Teeth and Oropharynx as per pre-operative assessment

## 2019-04-15 NOTE — H&P (Signed)
ORTHOPAEDIC H and P  REQUESTING PHYSICIAN: Nicholes Stairs, MD  PCP:  Emeterio Reeve, DO  Chief Complaint: Right knee pain.  HPI: Kelsey Matthews is a 58 y.o. female who complains of chronic right knee pain.  She has had many months of increasing pain and mechanical symptoms.  Work-up has demonstrated a stress fracture in the medial tibial plateau of the right knee with concomitant complex medial meniscus tear.  She is here today for arthroscopic intervention with percutaneous fixation of the tibial plateau.  No new complaints at this time and no acute events since our last office visit.  Past Medical History:  Diagnosis Date  . Family history of adverse reaction to anesthesia 7 yrs ago   mother had to be awoken from anesthesia due to heart issue with sinus surgery  . High cholesterol   . Seizure ([place])    last seizure 2010  . Torn meniscus    right  . Vitamin D deficiency    Past Surgical History:  Procedure Laterality Date  . ABDOMINAL HYSTERECTOMY     uterus removed has ovaries  . colonscopy  2019   polyps removed  . heel spur removed Bilateral   . KNEE ARTHROSCOPY Left   . meniscus repair Left 2018  . TUBAL LIGATION     Social History   Socioeconomic History  . Marital status: Married    Spouse name: Not on file  . Number of children: Not on file  . Years of education: Not on file  . Highest education level: Not on file  Occupational History  . Not on file  Tobacco Use  . Smoking status: Former Smoker    Packs/day: 1.00    Years: 10.00    Pack years: 10.00    Types: Cigarettes  . Smokeless tobacco: Never Used  . Tobacco comment: quit 1988  Substance and Sexual Activity  . Alcohol use: Yes    Comment: Monthly  . Drug use: Never  . Sexual activity: Yes    Partners: Male  Other Topics Concern  . Not on file  Social History Narrative   Left handed   Lives with husband in two story home   Completed HS. Cosmetology school   Social Determinants of  Health   Financial Resource Strain:   . Difficulty of Paying Living Expenses: Not on file  Food Insecurity:   . Worried About Charity fundraiser in the Last Year: Not on file  . Ran Out of Food in the Last Year: Not on file  Transportation Needs:   . Lack of Transportation (Medical): Not on file  . Lack of Transportation (Non-Medical): Not on file  Physical Activity:   . Days of Exercise per Week: Not on file  . Minutes of Exercise per Session: Not on file  Stress:   . Feeling of Stress : Not on file  Social Connections:   . Frequency of Communication with Friends and Family: Not on file  . Frequency of Social Gatherings with Friends and Family: Not on file  . Attends Religious Services: Not on file  . Active Member of Clubs or Organizations: Not on file  . Attends Archivist Meetings: Not on file  . Marital Status: Not on file   Family History  Problem Relation Age of Onset  . Cancer Mother        breast, thyroid  . Heart disease Mother   . Diabetes Father   . Hypertension Father   .  Bowel Disease Father    Allergies  Allergen Reactions  . Codeine     "takes me a while to respond"  . Prednisone     rash   Prior to Admission medications   Medication Sig Start Date End Date Taking? Authorizing Provider  acetaminophen (TYLENOL) 500 MG tablet Take 1,000 mg by mouth as needed for moderate pain.   Yes [provider]  divalproex (DEPAKOTE) 500 MG DR tablet Take 1 tablet (500 mg total) by mouth 2 (two) times daily. 03/02/19  Yes Cameron Sprang, MD  ibuprofen (ADVIL) 200 MG tablet Take 200 mg by mouth every 6 (six) hours as needed.   Yes [provider]  simvastatin (ZOCOR) 10 MG tablet Take 1 tablet (10 mg total) by mouth daily. Patient taking differently: Take 10 mg by mouth at bedtime.  11/26/18  Yes Emeterio Reeve, DO  Cholecalciferol (VITAMIN D3) 1.25 MG (50000 UT) CAPS Take 1 capsule every week for 6 weeks 03/03/19   Cameron Sprang, MD    Cholecalciferol (VITAMIN D3) 50 MCG (2000 UT) TABS Take by mouth daily. q fridays    [provider]   No results found.  Positive ROS: All other systems have been reviewed and were otherwise negative with the exception of those mentioned in the HPI and as above.  Physical Exam: General: Alert, no acute distress Cardiovascular: No pedal edema Respiratory: No cyanosis, no use of accessory musculature GI: No organomegaly, abdomen is soft and non-tender Skin: No lesions in the area of chief complaint Neurologic: Sensation intact distally Psychiatric: Patient is competent for consent with normal mood and affect Lymphatic: No axillary or cervical lymphadenopathy  MUSCULOSKELETAL:  Right lower extremity is neurovascular intact with no open wounds.  2+ distal pulses.  Assessment: 1.  Complex tear medial meniscus right knee, acute. 2.  Stress fracture medial tibial plateau right, closed.  Plan: -Our plan for today is for arthroscopic interventions on the right knee.  We will plan for partial medial meniscectomy and debridements as indicated.  We will then proceed with percutaneous fixation of stress fracture of medial tibial plateau.  We again discussed the risk and benefits of this procedure.  All questions were solicited and answered to her satisfaction.  She has provided informed consent to proceed.  -She will be discharged home postoperative from PACU weightbearing as tolerated with crutches as needed for assistance.    Nicholes Stairs, MD Cell [phone]    04/15/2019 11:18 AM

## 2019-04-15 NOTE — Progress Notes (Signed)
Assisted Dr. Hodierne with right, ultrasound guided, femoral block. Side rails up, monitors on throughout procedure. See vital signs in flow sheet. Tolerated Procedure well. 

## 2019-04-15 NOTE — Anesthesia Preprocedure Evaluation (Addendum)
Anesthesia Evaluation  Patient identified by MRN, date of birth, ID band Patient awake    Reviewed: Allergy & Precautions, H&P , NPO status , Patient's Chart, lab work & pertinent test results  History of Anesthesia Complications Negative for: history of anesthetic complications  Airway Mallampati: II   Neck ROM: full    Dental   Pulmonary former smoker,    breath sounds clear to auscultation       Cardiovascular negative cardio ROS   Rhythm:regular Rate:Normal     Neuro/Psych Seizures -, Well Controlled,  negative psych ROS   GI/Hepatic   Endo/Other  obese  Renal/GU      Musculoskeletal Right knee partial medial meniscus tear   Abdominal   Peds  Hematology   Anesthesia Other Findings   Reproductive/Obstetrics                            Anesthesia Physical Anesthesia Plan  ASA: II  Anesthesia Plan: General   Post-op Pain Management:  Regional for Post-op pain   Induction: Intravenous  PONV Risk Score and Plan: 3 and Ondansetron, Dexamethasone, Midazolam and Treatment may vary due to age or medical condition  Airway Management Planned: LMA  Additional Equipment:   Intra-op Plan:   Post-operative Plan:   Informed Consent: I have reviewed the patients History and Physical, chart, labs and discussed the procedure including the risks, benefits and alternatives for the proposed anesthesia with the patient or authorized representative who has indicated his/her understanding and acceptance.       Plan Discussed with: CRNA, Anesthesiologist and Surgeon  Anesthesia Plan Comments:         Anesthesia Quick Evaluation

## 2019-04-15 NOTE — Discharge Instructions (Signed)
-Okay for full weightbearing as tolerated to the right lower extremity.  -May use crutches as needed for support.  -Apply ice to the right knee for 30 minutes at a time per hour that you are awake.  -For mild to moderate pain use Tylenol and Advil around-the-clock.  For breakthrough pain use oxycodone as needed.  You may take 5 to 10 mg per dose of oxycodone.  -For the prevention of blood clots take a an 81 mg aspirin once per day for 6 weeks.  -You may remove your postoperative dressings in 3 days.  On the third day you may begin showering.  Do not submerge underwater.  -You should begin moving the knee as tolerated as soon as you are able.  ARTHROSCOPIC KNEE SURGERY HOME CARE INSTRUCTIONS   PAIN You will be expected to have a moderate amount of pain in the affected knee for approximately two weeks.  However, the first two to four days will be the most severe in terms of the pain you will experience.  Prescriptions have been provided for you to take as needed for the pain.  The pain can be markedly reduced by using the ice/compressive bandage given.  Exchange the ice packs whenever they thaw.  During the night, keep the bandage on because it will still provide some compression for the swelling.  Also, keep the leg elevated on pillows above your heart, and this will help alleviate the pain and swelling.  MEDICATION Prescriptions have been provided to take as needed for pain. To prevent blood clots, take Aspirin 325mg  daily with a meal if not on a blood thinner and if no history of stomach ulcers.  ACTIVITY It is preferred that you stay on bedrest for approximately 24 hours.  However, you may go to the bathroom with help.  After this, you can start to be up and about progressively more.  Remember that the swelling may still increase after three to four days if you are up and doing too much.  You may put as much weight on the affected leg as pain will allow.  Use your crutches for comfort and  safety.  However, as soon as you are able, you may discard the crutches and go without them.   DRESSING Keep the current dressing as dry as possible.  Three days after your surgery, you may remove the ice/compressive wrap, and surgical dressing.  You may now take a shower, but do not scrub the sounds directly with soap.  Let water rinse over these and gently wipe with your hand.  Reapply band-aids over the puncture wounds and more gauze if needed.  A slight amount of thin drainage can be normal at this time, and do not let it frighten you.  Reapply the ice/compressive wrap.  You may now repeat this every day each time you shower.  SYMPTOMS TO REPORT TO YOUR DOCTOR  -Extreme pain.  -Extreme swelling.  -Temperature above 101 degrees that does not come down with acetaminophen     (Tylenol).  -Any changes in the feeling, color or movement of your toes.  -Extreme redness, heat, swelling or drainage at your incision    Regional Anesthesia Blocks  1. Numbness or the inability to move the "blocked" extremity may last from 3-48 hours after placement. The length of time depends on the medication injected and your individual response to the medication. If the numbness is not going away after 48 hours, call your surgeon.  2. The extremity that is blocked  will need to be protected until the numbness is gone and the  Strength has returned. Because you cannot feel it, you will need to take extra care to avoid injury. Because it may be weak, you may have difficulty moving it or using it. You may not know what position it is in without looking at it while the block is in effect.  3. For blocks in the legs and feet, returning to weight bearing and walking needs to be done carefully. You will need to wait until the numbness is entirely gone and the strength has returned. You should be able to move your leg and foot normally before you try and bear weight or walk. You will need someone to be with you when you first  try to ensure you do not fall and possibly risk injury.  4. Bruising and tenderness at the needle site are common side effects and will resolve in a few days.  5. Persistent numbness or new problems with movement should be communicated to the surgeon or the [facility] [phone] [place] [phone]).   Post Anesthesia Home Care Instructions  Activity: Get plenty of rest for the remainder of the day. A responsible adult should stay with you for 24 hours following the procedure.  For the next 24 hours, DO NOT: -Drive a car -Paediatric nurse -Drink alcoholic beverages -Take any medication unless instructed by your physician -Make any legal decisions or sign important papers.  Meals: Start with liquid foods such as gelatin or soup. Progress to regular foods as tolerated. Avoid greasy, spicy, heavy foods. If nausea and/or vomiting occur, drink only clear liquids until the nausea and/or vomiting subsides. Call your physician if vomiting continues.  Special Instructions/Symptoms: Your throat may feel dry or sore from the anesthesia or the breathing tube placed in your throat during surgery. If this causes discomfort, gargle with warm salt water. The discomfort should disappear within 24 hours.  If you had a scopolamine patch placed behind your ear for the management of post- operative nausea and/or vomiting:  1. The medication in the patch is effective for 72 hours, after which it should be removed.  Wrap patch in a tissue and discard in the trash. Wash hands thoroughly with soap and water. 2. You may remove the patch earlier than 72 hours if you experience unpleasant side effects which may include dry mouth, dizziness or visual disturbances. 3. Avoid touching the patch. Wash your hands with soap and water after contact with the patch.

## 2019-04-15 NOTE — Op Note (Signed)
Surgeon(s): Nicholes Stairs, MD   ANESTHESIA:  general, and regional   FLUIDS: Per anesthesia record.    ESTIMATED BLOOD LOSS: minimal     PREOPERATIVE DIAGNOSES:  1. Right knee medial meniscus tear 2.  Right medial tibial plateau insufficiency fracture 3.   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 Right knee arthroscopically aided treatment of medial tibial plateau insufficiency fracture with percutaneous internal fixation (subchondroplasty)  2. Right knee arthroscopy with arthroscopic partial medial meniscectomy 3.  Chondroplasty, right medial femoral condyle, medial plateau and lateral patellar facet   Implant: Flowable calcium phosphate, 5 mL. Zimmer   DESCRIPTION OF PROCEDURE: The patient has a right knee medial meniscus tear. They have had pain that has been refractory to conservative management. Their preoperative MRI demonstrated subchondral bone marrow edema and insufficiency fractures of the medial tibial plateau as well as the medial meniscus tear. Plans are to proceed with partial medial meniscectomy, internal fixation of subchondral insufficiency fractures with flowable calcium phosphate, and diagnostic arthroscopy with debridement as indicated. Full discussion held regarding risks benefits alternatives and complications related surgical intervention. Conservative care options reviewed. All questions answered.   The patient was identified in the preoperative holding area and the operative extremity was marked. The patient was brought to the operating room and transferred to operating table in a supine position. Satisfactory general anesthesia was induced by anesthesiology.     Standard anterolateral, anteromedial arthroscopy portals were obtained. The anteromedial portal was obtained with a spinal needle for localization under direct  visualization with subsequent diagnostic findings.    Anteromedial and anterolateral chambers: moderate synovitis. The synovitis was debrided with a 4.0 mm full radius shaver through both the anteromedial and lateral portals.    Suprapatellar pouch and gutters: mild synovitis or debris. Patella chondral surface: Grade II on the lateral facet Trochlear chondral surface: Grade 0 Patellofemoral tracking: level Medial meniscus: posterior horn, complex bird beak tear of the juxta posterior root Medial femoral condyle flexion bearing surface: Grade 3 Medial femoral condyle extension bearing surface: Grade 2 Medial tibial plateau: Grade 2 Anterior cruciate ligament:stable Posterior cruciate ligament:stable Lateral meniscus: no tear.   Lateral femoral condyle flexion bearing surface: Grade 0 Lateral femoral condyle extension bearing surface: Grade 0 Lateral tibial plateau: Grade 0   Medial meniscus tear was debrided using biters and motorized shaver alternating until a stable remnant was left. Upon completion the probe was used to evaluate and assess the remaining meniscus which was gleaned to be stable.   Chondroplasty was achieved on the medial femoral condyle using a motorized shaver to debride the grade 3 unstable cartilage. Completion of the chondroplasty left A medial femoral condyle with smooth stable surface. There was no full-thickness component noted.   Next we turned our attention to the internal fixation of the medial tibial condyle. Arthroscopically we evaluated medial tibial condyle noted there was no loose cartilage or debris surrounding the lesion and the fracture did not propagate to the joint surface. Using preoperative MRI we targeted the delivery device to just under the subchondral density and in the lesion. This was achieved with intraoperative fluoroscopy. Once accurate placement was noted on 2 views and confirmed we delivered 3 mL of flowable calcium phosphate into the medial  tibial anterior plateau lesion. We left the cannulas in place for approximately 8 minutes while the implant hardened. We removed the cannulas  and again took 2 views of fluoroscopic pictures to confirm there was no extravasation outside of the bone. There was none noted.   After completion of synovectomy, diagnostic exam, and debridements as described, all compartments were checked and no residual debris remained. Hemostasis was achieved with the cautery wand. The portals were approximated with nylon suture. All excess fluid was expressed from the joint.  Xeroform sterile gauze dressings were applied followed by Ace bandage and ice pack.    There were no immediate competitions and all counts were correct.   DISPOSITION: The patient was awakened from general anesthetic, extubated, taken to the recovery room in medically stable condition, no apparent complications. The patient may be weightbearing as tolerated to the operative lower extremity with crutches.  Range of motion of right knee as tolerated.  They will use daily 81 mg asa for DVT ppx, and return in 2 weeks for suture removal.   Nicholes Stairs

## 2019-04-15 NOTE — Transfer of Care (Addendum)
Immediate Anesthesia Transfer of Care Note  Patient: Kelsey Matthews  Procedure(s) Performed: Procedure(s) (LRB): Right knee arthroscopicpartial medial meniscectomy with medial tibial plateau subchondroplasty (Right)  Patient Location: PACU  Anesthesia Type: General  Level of Consciousness: awake, sedated, patient cooperative and responds to stimulation  Airway & Oxygen Therapy: Patient Spontanous Breathing and Patient connected to [place] 02 and soft FM   Post-op Assessment: Report given to PACU RN, Post -op Vital signs reviewed and stable and Patient moving all extremities  Post vital signs: Reviewed and stable  Complications: No apparent anesthesia complications

## 2019-04-15 NOTE — Brief Op Note (Signed)
04/15/2019  1:02 PM  PATIENT:  Kelsey Matthews  58 y.o. female  PRE-OPERATIVE DIAGNOSIS:  Right knee partial medial meniscus tear, right medial tibia stress fracture  POST-OPERATIVE DIAGNOSIS:  Same  PROCEDURE:  Procedure(s) with comments: Right knee arthroscopicpartial medial meniscectomy with medial tibial plateau subchondroplasty (Right) - 75 mins  SURGEON:  Surgeon(s) and Role:    * Stann Mainland, Elly Modena, MD - Primary  PHYSICIAN ASSISTANT:   ASSISTANTS: none   ANESTHESIA:   local, regional and general  EBL:  5 mL   BLOOD ADMINISTERED:none  DRAINS: none   LOCAL MEDICATIONS USED:  MARCAINE     SPECIMEN:  No Specimen  DISPOSITION OF SPECIMEN:  N/A  COUNTS:  YES  TOURNIQUET:   Total Tourniquet Time Documented: Thigh (Right) - 33 minutes Total: Thigh (Right) - 33 minutes   DICTATION: .Note written in EPIC  PLAN OF CARE: Discharge to home after PACU  PATIENT DISPOSITION:  PACU - hemodynamically stable.   Delay start of Pharmacological VTE agent (>24hrs) due to surgical blood loss or risk of bleeding: not applicable

## 2019-04-22 ENCOUNTER — Other Ambulatory Visit: Payer: Self-pay

## 2019-04-22 ENCOUNTER — Ambulatory Visit (INDEPENDENT_AMBULATORY_CARE_PROVIDER_SITE_OTHER): Payer: Self-pay | Admitting: Rehabilitative and Restorative Service Providers"

## 2019-04-22 DIAGNOSIS — R2689 Other abnormalities of gait and mobility: Secondary | ICD-10-CM

## 2019-04-22 DIAGNOSIS — M6281 Muscle weakness (generalized): Secondary | ICD-10-CM

## 2019-04-22 DIAGNOSIS — M25561 Pain in right knee: Secondary | ICD-10-CM

## 2019-04-22 NOTE — Patient Instructions (Signed)
Access Code: [email or web address]  URL: [email or web address]  Date: 04/22/2019  Prepared by: Rudell Cobb   Exercises Supine Quad Set - 10 reps - 1 sets - 2x daily - 7x weekly Straight Leg Raise - 10 reps - 1 sets - 2x daily - 7x weekly Prone Knee Extension Hang - 1 reps - 1 sets - 3 minutes hold - 2x daily - 7x weekly Sidelying Hip Abduction - 10 reps - 1 sets - 2x daily - 7x weekly

## 2019-04-22 NOTE — Therapy (Signed)
[place], [place], [address] Phone: [phone]   Fax:  [phone]  Physical Therapy Evaluation  Patient Details  Name: Kelsey Matthews MRN: [id number] Date of Birth: [birth year] Referring Provider (PT): Victorino December, MD   Encounter Date: 04/22/2019  PT End of Session - 04/22/19 0851    Visit Number  1    Number of Visits  12    Date for PT Re-Evaluation  06/06/19    PT Start Time  0803    PT Stop Time  0845    PT Time Calculation (min)  42 min    Activity Tolerance  Patient tolerated treatment well    Behavior During Therapy  [facility] for tasks assessed/performed       Past Medical History:  Diagnosis Date  . Family history of adverse reaction to anesthesia 7 yrs ago   mother had to be awoken from anesthesia due to heart issue with sinus surgery  . High cholesterol   . Seizure ([place])    last seizure 2010  . Torn meniscus    right  . Vitamin D deficiency     Past Surgical History:  Procedure Laterality Date  . ABDOMINAL HYSTERECTOMY     uterus removed has ovaries  . colonscopy  2019   polyps removed  . heel spur removed Bilateral   . KNEE ARTHROSCOPY Left   . KNEE ARTHROSCOPY WITH SUBCHONDROPLASTY Right 04/15/2019   Procedure: Right knee arthroscopicpartial medial meniscectomy with medial tibial plateau subchondroplasty;  Surgeon: Nicholes Stairs, MD;  Location: [facility];  Service: Orthopedics;  Laterality: Right;  75 mins  . meniscus repair Left 2018  . TUBAL LIGATION      There were no vitals filed for this visit.   Subjective Assessment - 04/22/19 0820    Subjective  The patient had a fall May 2020 when rolling out of bed sustaining a tibial plateau fracture and a meniscus tear (also notes knee trauma when mowing lawn and stepping into a hole).   On 04/15/19, she underwent a medial tibial plateau subchondroplasty and a partial medial medial meniscectomy.  She  reports she was told to ice and elevate without precautions.  She was on crutches the first couple of days and has progressed to [facility] (arrives using it in the right hand).    Pertinent History  seizure disorder (precaution with e-stim), h/o L knee meniscus repair    Patient Stated Goals  Be able to go back to work, walk and be able to function.    Currently in Pain?  Yes    Pain Score  4     Pain Location  Knee    Pain Orientation  Right    Pain Descriptors / Indicators  Sore    Pain Type  Surgical pain    Pain Onset  1 to 4 weeks ago    Pain Frequency  Constant         [facility] PT Assessment - 04/22/19 [id number]      Assessment   Medical Diagnosis  s/p partial medial meniscectomy and tibial plateau condroplasty    Referring Provider (PT)  Victorino December, MD      Precautions   Precautions  Knee    Precaution Comments  Precautions post partial meniscectomy      Restrictions   Weight Bearing Restrictions  Yes    RLE Weight Bearing  Weight bearing as tolerated      Balance Screen  Has the patient fallen in the past 6 months  Yes    How many times?  1- out of bed in May    Has the patient had a decrease in activity level because of a fear of falling?   No    Is the patient reluctant to leave their home because of a fear of falling?   No      Home Environment   Living Environment  Private residence    Living Arrangements  Spouse/significant other    Type of [place] to enter    Entrance Stairs-Number of Steps  12    Entrance Stairs-Rails  --   can reach one rail   Home Layout  Laundry or work area in [employer] - single point;Crutches      Prior Function   Level of Independence  Independent    Vocation  --   not working since relocation to [place] for mother      Cognition   Overall Cognitive Status  Within Functional Limits for tasks assessed      Observation/Other Assessments   Focus on Therapeutic Outcomes (FOTO)    47% (53% limited)      Sensation   Light Touch  Appears Intact      ROM / Strength   AROM / PROM / Strength  AROM;Strength;PROM      AROM   Overall AROM   Deficits    AROM Assessment Site  Knee    Right/Left Knee  Right;Left    Right Knee Extension  -20    Right Knee Flexion  85    Left Knee Extension  0    Left Knee Flexion  110      PROM   Overall PROM   Deficits    Overall PROM Comments  knee R side -8 to 90 degrees      Strength   Overall Strength  Deficits    Overall Strength Comments  R hip flexion 3/5 for SLR, hip abduction 4/5, hip extension NT      Palpation   Palpation comment  significant pain with hamstring palpation      Ambulation/Gait   Ambulation/Gait  Yes    Ambulation/Gait Assistance  6: Modified independent (Device/Increase time)    Ambulation Distance (Feet)  100 Feet    Assistive device  Straight cane    Gait Pattern  Decreased stance time - right;Decreased step length - left;Decreased weight shift to right;Right foot flat;Right flexed knee in stance    Ambulation Surface  Level;Indoor    Gait velocity  0.5 m/s (1.63 ft/sec)    Stairs  Yes    Stairs Assistance  6: Modified independent (Device/Increase time)    Stair Management Technique  Step to pattern    Number of Stairs  6                Objective measurements completed on examination: See above findings.      [facility] Adult PT Treatment/Exercise - 04/22/19 0822      Ambulation/Gait   Gait Comments  Gait training educating patient on proper cane use (in left hand) and working on increasing extension during mid stance of gait.      Exercises   Exercises  Knee/Hip      Knee/Hip Exercises: Stretches   Other Knee/Hip Stretches  prone knee hang  Knee/Hip Exercises: Supine   Quad Sets  Right;10 reps    Straight Leg Raises  Strengthening;Right;10 reps      Knee/Hip Exercises: Sidelying   Hip ABduction  Strengthening;Right;10 reps      Modalities   Modalities   Vasopneumatic      Vasopneumatic   Number Minutes Vasopneumatic   10 minutes    Vasopnuematic Location   Knee    Vasopneumatic Pressure  Low    Vasopneumatic Temperature   34             PT Education - 04/22/19 1157    Education Details  HEP initiated    Person(s) Educated  Patient    Methods  Explanation;Demonstration;Handout    Comprehension  Returned demonstration;Verbalized understanding          PT Long Term Goals - 04/22/19 1203      PT LONG TERM GOAL #1   Title  The patient will return demo HEP.    Time  6    Period  Weeks    Target Date  06/06/19      PT LONG TERM GOAL #2   Title  The patient will improve R knee AROM from 20-85 up to 5-115 degrees of motion.    Time  6    Period  Weeks    Target Date  06/06/19      PT LONG TERM GOAL #3   Title  The patient will ambulate for household and community distances without a device independently.    Time  6    Period  Weeks    Target Date  06/06/19      PT LONG TERM GOAL #4   Title  The patient will report < 35% limitation on FOTO (from 53%)    Time  6    Period  Weeks    Target Date  06/06/19      PT LONG TERM GOAL #5   Title  The patient will negotiate steps with reciprocal pattern and one handrail mod indep.    Time  6    Period  Weeks    Target Date  06/06/19             Plan - 04/22/19 1219    Clinical Impression Statement  The patient is a 58 year old female presenting to outpatient physical therapy s/p R knee partial medial meniscectomy and subchondroplasty tibial plateau.  She presents with impairments in R knee ROM, decreased motor recruitment R quad + VMO, decreaed strength R LE, gait abnormalities and impaired functional mobility.  PT to address deficits to return to prior level of function.    Examination-Activity Limitations  Bathing;Locomotion Level;Lift;Squat;Stairs;Stand    Examination-Participation Restrictions  Cleaning;Community Activity;Driving    Stability/Clinical Decision  Making  Stable/Uncomplicated    Clinical Decision Making  Low    Rehab Potential  Good    PT Frequency  2x / week    PT Duration  6 weeks    PT Treatment/Interventions  ADLs/Self Care Home Management;Gait training;Stair training;Functional mobility training;Therapeutic activities;Therapeutic exercise;Cryotherapy;Ultrasound;Moist Heat;Iontophoresis 4mg /ml Dexamethasone;Balance training;Neuromuscular re-education;Manual techniques;Patient/family education;Taping;Dry needling    PT Next Visit Plan  No e-stim if we can elicit quad contraction without stim due to h/o seizures; Also note:  Patient insurance not aurthorized for PT so consider that for all scheduling and provide as much through HEP and education; Check and progress HEP from eval; Work on gait mechanics, gaining ROM, and progressing strengthening to tolerance.    Consulted  and Agree with Plan of Care  Patient       Patient will benefit from skilled therapeutic intervention in order to improve the following deficits and impairments:  Abnormal gait, Decreased range of motion, Decreased strength, Impaired flexibility, Pain, Decreased balance, Increased muscle spasms  Visit Diagnosis: Muscle weakness (generalized)  Other abnormalities of gait and mobility  Acute pain of right knee     Problem List Patient Active Problem List   Diagnosis Date Noted  . Mixed hyperlipidemia 11/26/2018  . 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 Vitamin D deficiency 11/26/2018  . History of hysterectomy 11/26/2018  . Family history of breast cancer 11/26/2018  . History of colon polyps 11/26/2018  . Right knee pain 11/26/2018    Somya Jauregui, PT 04/22/2019, 12:36 PM  [facility] [place], [place], [address] Phone: [phone]   Fax:  [phone]  Name: Chaz Lester MRN: [id number] Date of Birth: [birth year]

## 2019-04-27 ENCOUNTER — Other Ambulatory Visit: Payer: Self-pay

## 2019-04-27 ENCOUNTER — Ambulatory Visit (INDEPENDENT_AMBULATORY_CARE_PROVIDER_SITE_OTHER): Payer: Self-pay | Admitting: Physical Therapy

## 2019-04-27 DIAGNOSIS — R2689 Other abnormalities of gait and mobility: Secondary | ICD-10-CM

## 2019-04-27 DIAGNOSIS — M6281 Muscle weakness (generalized): Secondary | ICD-10-CM

## 2019-04-27 DIAGNOSIS — M25561 Pain in right knee: Secondary | ICD-10-CM

## 2019-04-27 NOTE — Therapy (Addendum)
[facility] [place], [place], [address] Phone: [phone]   Fax:  [phone]  Physical Therapy Treatment and Discharge Summary  Patient Details  Name: Kelsey Matthews MRN: [id number] Date of Birth: [birth year] Referring Provider (PT): Victorino December, MD   Encounter Date: 04/27/2019  PT End of Session - 04/27/19 1254    Visit Number  2    Number of Visits  12    Date for PT Re-Evaluation  06/06/19    PT Start Time  1150    PT Stop Time  1224    PT Time Calculation (min)  45 min    Activity Tolerance  Patient tolerated treatment well;No increased pain    Behavior During Therapy  WFL for tasks assessed/performed       Past Medical History:  Diagnosis Date  . Family history of adverse reaction to anesthesia 7 yrs ago   mother had to be awoken from anesthesia due to heart issue with sinus surgery  . High cholesterol   . Seizure ([place])    last seizure 2010  . Torn meniscus    right  . Vitamin D deficiency     Past Surgical History:  Procedure Laterality Date  . ABDOMINAL HYSTERECTOMY     uterus removed has ovaries  . colonscopy  2019   polyps removed  . heel spur removed Bilateral   . KNEE ARTHROSCOPY Left   . KNEE ARTHROSCOPY WITH SUBCHONDROPLASTY Right 04/15/2019   Procedure: Right knee arthroscopicpartial medial meniscectomy with medial tibial plateau subchondroplasty;  Surgeon: Nicholes Stairs, MD;  Location: [facility];  Service: Orthopedics;  Laterality: Right;  75 mins  . meniscus repair Left 2018  . TUBAL LIGATION      There were no vitals filed for this visit.  Subjective Assessment - 04/27/19 1155    Subjective  Pt reports she has been weaning from cane in household.  She is wearing ace wrap daily.  She reports less knee pain, and now intermittent.    Pertinent History  seizure disorder (precaution with e-stim), h/o L knee meniscus repair    Patient Stated Goals  Be  able to go back to work, walk and be able to function.    Currently in Pain?  Yes    Pain Score  4     Pain Location  Knee    Pain Orientation  Right;Medial    Pain Descriptors / Indicators  Sore    Aggravating Factors   end of day; prolonged positions    Pain Relieving Factors  exercise, OTC meds         [facility] PT Assessment - 04/27/19 0001      Assessment   Medical Diagnosis  s/p partial medial meniscectomy and tibial plateau chondroplasty    Referring Provider (PT)  Victorino December, MD      Restrictions   RLE Weight Bearing  Weight bearing as tolerated      AROM   Right/Left Knee  Right    Right Knee Extension  -10   supine   Right Knee Flexion  98   supine, heel slide with strap     [facility] Adult PT Treatment/Exercise - 04/27/19 0001      Knee/Hip Exercises: Stretches   Passive Hamstring Stretch  Right;2 reps;20 seconds    Gastroc Stretch  Right;Left;2 reps;20 seconds      Knee/Hip Exercises: Aerobic   Nustep  L4: 5 min, for ROM  Knee/Hip Exercises: Standing   Heel Raises  Both;1 set;10 reps   with toe raises   Heel Raises Limitations  cues to keep knees straight    Lateral Step Up  Right;1 set;10 reps;Step Height: 6";Hand Hold: 2    Forward Step Up  Right;1 set;10 reps    Forward Step Up Limitations  cues for sequence    Wall Squat  1 set;10 reps;3 seconds   mini squat   Stairs  reciprocal pattern with 3-6" step x 10, with bilat railing    SLS  Rt SLS x 3 reps, without support - up to 5 sec     Gait Training  160 ft without AD, with cues to decrease step length, increase Rt heel strike, narrow base of support. Improved with cues.       Knee/Hip Exercises: Supine   Quad Sets  Right;1 set;5 reps   10 sec hold; in long sitting    Heel Slides  Right   1 rep with strap   Straight Leg Raises  Right   1 rep demo from HEP     Knee/Hip Exercises: Prone   Prone Knee Hang  --   verbally reviewed.      Vasopneumatic   Number Minutes Vasopneumatic   10 minutes     Vasopnuematic Location   Knee   Rt   Vasopneumatic Pressure  Medium    Vasopneumatic Temperature   34                  PT Long Term Goals - 04/27/19 1257      PT LONG TERM GOAL #1   Title  The patient will return demo HEP.    Time  6    Period  Weeks    Status  On-going      PT LONG TERM GOAL #2   Title  The patient will improve R knee AROM from 20-85 up to 5-115 degrees of motion.    Time  6    Period  Weeks    Status  On-going      PT LONG TERM GOAL #3   Title  The patient will ambulate for household and community distances without a device independently.    Time  6    Period  Weeks    Status  Partially Met      PT LONG TERM GOAL #4   Title  The patient will report < 35% limitation on FOTO (from 53%)    Time  6    Period  Weeks    Status  On-going      PT LONG TERM GOAL #5   Title  The patient will negotiate steps with reciprocal pattern and one handrail mod indep.    Time  6    Period  Weeks    Status  Achieved            Plan - 04/27/19 1258    Clinical Impression Statement  Pt able to ambulate household distances safely without assistive device, and able to ascend/descend steps with reciprocal pattern.  Pt demonstrated improved Rt knee ROM, 10-98 deg flexion. Pt making good progress towards goals.    Examination-Activity Limitations  Bathing;Locomotion Level;Lift;Squat;Stairs;Stand    Examination-Participation Restrictions  Cleaning;Community Activity;Driving    Stability/Clinical Decision Making  Stable/Uncomplicated    Rehab Potential  Good    PT Frequency  2x / week    PT Duration  6 weeks    PT Treatment/Interventions  ADLs/Self  Care Home Management;Gait training;Stair training;Functional mobility training;Therapeutic activities;Therapeutic exercise;Cryotherapy;Ultrasound;Moist Heat;Iontophoresis 74m/ml Dexamethasone;Balance training;Neuromuscular re-education;Manual techniques;Patient/family education;Taping;Dry needling    PT Next Visit  Plan  Patient insurance not aurthorized for PT so consider that for all scheduling and provide as much through HEP and education;   Progress HEP as tolerated.    Consulted and Agree with Plan of Care  Patient       Patient will benefit from skilled therapeutic intervention in order to improve the following deficits and impairments:  Abnormal gait, Decreased range of motion, Decreased strength, Impaired flexibility, Pain, Decreased balance, Increased muscle spasms  Visit Diagnosis: Muscle weakness (generalized)  Other abnormalities of gait and mobility  Acute pain of right knee    PHYSICAL THERAPY DISCHARGE SUMMARY  Visits from Start of Care: 2  Current functional level related to goals / functional outcomes: The patient did not return to PT due to limited insurance coverage.  See above for patinet status at time of d/c.    Education / Equipment: HEP, gait training.  Plan: Patient agrees to discharge.  Patient goals were partially met. Patient is being discharged due to financial reasons.  ?????       Thank you for the referral of this patient. CRudell Cobb MPT    Problem List Patient Active Problem List   Diagnosis Date Noted  . Mixed hyperlipidemia 11/26/2018  . Nonintractable epilepsy without status epilepticus ([place] 11/26/2018  . Vitamin D deficiency 11/26/2018  . History of hysterectomy 11/26/2018  . Family history of breast cancer 11/26/2018  . History of colon polyps 11/26/2018  . Right knee pain 11/26/2018   JKerin Perna PTA 04/27/19 1:05 PM  [place] [place] [address]Phone: [phone]  Fax:  [phone] Name: TBertrice LederMRN: [id number] of Birth: 8March 09, 1962

## 2019-05-05 ENCOUNTER — Encounter: Payer: 59 | Admitting: Physical Therapy

## 2019-06-29 ENCOUNTER — Encounter: Payer: 59 | Admitting: Osteopathic Medicine

## 2019-08-05 ENCOUNTER — Encounter: Payer: Self-pay | Admitting: Osteopathic Medicine

## 2019-08-05 ENCOUNTER — Ambulatory Visit (INDEPENDENT_AMBULATORY_CARE_PROVIDER_SITE_OTHER): Payer: No Typology Code available for payment source | Admitting: Osteopathic Medicine

## 2019-08-05 VITALS — BP 133/90 | HR 85 | Ht 64.0 in | Wt 208.0 lb

## 2019-08-05 DIAGNOSIS — E559 Vitamin D deficiency, unspecified: Secondary | ICD-10-CM

## 2019-08-05 DIAGNOSIS — Z0000 Encounter for general adult medical examination without abnormal findings: Secondary | ICD-10-CM

## 2019-08-05 DIAGNOSIS — Z86010 Personal history of colonic polyps: Secondary | ICD-10-CM | POA: Diagnosis not present

## 2019-08-05 DIAGNOSIS — Z1231 Encounter for screening mammogram for malignant neoplasm of breast: Secondary | ICD-10-CM

## 2019-08-05 DIAGNOSIS — G40909 Epilepsy, unspecified, not intractable, without status epilepticus: Secondary | ICD-10-CM

## 2019-08-05 DIAGNOSIS — R7309 Other abnormal glucose: Secondary | ICD-10-CM

## 2019-08-05 DIAGNOSIS — E782 Mixed hyperlipidemia: Secondary | ICD-10-CM

## 2019-08-05 NOTE — Progress Notes (Signed)
HPI: Kelsey Matthews is a 59 y.o. female who  has a past medical history of Family history of adverse reaction to anesthesia (7 yrs ago), High cholesterol, Seizure ([place]), Torn meniscus, and Vitamin D deficiency.  she presents to [facility], 08/05/19,  for chief complaint of: Annual physical     Patient here for annual physical / wellness exam.  See preventive care reviewed as below.    Additional concerns today include:  None, doing well Some troubling news from her mo about a sick family friend, pt attributes high BP recheck to this.    Past medical, surgical, social and family history reviewed:  Patient Active Problem List   Diagnosis Date Noted  . Mixed hyperlipidemia 11/26/2018  . Nonintractable epilepsy without status epilepticus ([place]) 11/26/2018  . Vitamin D deficiency 11/26/2018  . History of hysterectomy 11/26/2018  . Family history of breast cancer 11/26/2018  . History of colon polyps 11/26/2018  . Right knee pain 11/26/2018    Past Surgical History:  Procedure Laterality Date  . ABDOMINAL HYSTERECTOMY     uterus removed has ovaries  . colonscopy  2019   polyps removed  . heel spur removed Bilateral   . KNEE ARTHROSCOPY Left   . KNEE ARTHROSCOPY WITH SUBCHONDROPLASTY Right 04/15/2019   Procedure: Right knee arthroscopicpartial medial meniscectomy with medial tibial plateau subchondroplasty;  Surgeon: Nicholes Stairs, MD;  Location: [facility];  Service: Orthopedics;  Laterality: Right;  75 mins  . meniscus repair Left 2018  . TUBAL LIGATION      Social History   Tobacco Use  . Smoking status: Former Smoker    Packs/day: 1.00    Years: 10.00    Pack years: 10.00    Types: Cigarettes  . Smokeless tobacco: Never Used  . Tobacco comment: quit 1988  Substance Use Topics  . Alcohol use: Yes    Comment: Monthly    Family History  Problem Relation Age of Onset  . Cancer Mother        breast,  thyroid  . Heart disease Mother   . Diabetes Father   . Hypertension Father   . Bowel Disease Father      Current medication list and allergy/intolerance information reviewed:    Current Outpatient Medications  Medication Sig Dispense Refill  . Cholecalciferol (VITAMIN D3) 50 MCG (2000 UT) TABS Take by mouth daily. q fridays    . divalproex (DEPAKOTE) 500 MG DR tablet Take 1 tablet (500 mg total) by mouth 2 (two) times daily. 180 tablet 3  . ibuprofen (ADVIL) 200 MG tablet Take 200 mg by mouth every 6 (six) hours as needed.    . simvastatin (ZOCOR) 10 MG tablet Take 1 tablet (10 mg total) by mouth daily. (Patient taking differently: Take 10 mg by mouth at bedtime. ) 90 tablet 3   No current facility-administered medications for this visit.    Allergies  Allergen Reactions  . Codeine     "takes me a while to respond"  . Prednisone     rash      Review of Systems:  Constitutional:  No  fever, no chills, No recent illness, No unintentional weight changes. No significant fatigue.   HEENT: No  headache, no vision change  Cardiac: No  chest pain, No  pressure, No palpitations,  Respiratory:  No  shortness of breath. No  Cough  Gastrointestinal: No  abdominal pain  Musculoskeletal: No new myalgia/arthralgia  Skin: No  Rash, No other wounds/concerning lesions  Genitourinary: No  incontinence, No  abnormal genital bleeding, No abnormal genital discharge  Neurologic: No  weakness, No  dizziness  Psychiatric: No  concerns with depression, No  concerns with anxiety, No sleep problems, No mood problems  Exam:  BP 133/90   Pulse 85   Ht 5\' 4"  (1.626 m)   Wt 208 lb (94.3 kg)   BMI 35.70 kg/m   Constitutional: VS see above. General Appearance: alert, well-developed, well-nourished, NAD  Eyes: Normal lids and conjunctive, non-icteric sclera  Ears, Nose, Mouth, Throat: Mask over nose/mouth TM normal bilaterally.   Neck: No masses, trachea midline. No thyroid enlargement.  No tenderness/mass appreciated. No lymphadenopathy  Respiratory: Normal respiratory effort. no wheeze, no rhonchi, no rales  Cardiovascular: S1/S2 normal, no murmur, no rub/gallop auscultated. RRR. No lower extremity edema.   Gastrointestinal: Nontender, no masses. No hepatomegaly, no splenomegaly. No hernia appreciated. Bowel sounds normal. Rectal exam deferred.    Musculoskeletal: Gait normal. No clubbing/cyanosis of digits.   Neurological: Normal balance/coordination. No tremor.   Skin: warm, dry, intact.   Psychiatric: Normal judgment/insight. Normal mood and affect. Oriented x3.    No results found for this or any previous visit (from the past 72 hour(s)).  No results found.   ASSESSMENT/PLAN: The primary encounter diagnosis was Annual physical exam. Diagnoses of Vitamin D deficiency, Mixed hyperlipidemia, History of colon polyps, Nonintractable epilepsy without status epilepticus, unspecified epilepsy type ([place]), Breast cancer screening by mammogram, and Elevated glucose level were also pertinent to this visit.   Orders Placed This Encounter  Procedures  . MM 3D SCREEN BREAST BILATERAL  . CBC  . COMPLETE METABOLIC PANEL WITH GFR  . Lipid panel  . Hemoglobin A1c  . VITAMIN D 25 Hydroxy (Vit-D Deficiency, Fractures)  . Valproic Acid level    No orders of the defined types were placed in this encounter.   Patient Instructions  General Preventive Care  Most recent routine screening labs: ordered today to follow up given previously elevated sugars, elevated Depakote level.   Everyone should have blood pressure checked once per year. Goal 130/80 or less.   Tobacco: don't!   Alcohol: responsible moderation is ok for most adults - if you have concerns about your alcohol intake, please talk to me!   Exercise: as tolerated to reduce risk of cardiovascular disease and diabetes. Strength training will also prevent osteoporosis.   Mental health: if need for mental health  care (medicines, counseling, other), or concerns about moods, please let me know!   Sexual health: if need for STD testing, or if concerns with libido/pain problems, please let me know!  Advanced Directive: Living Will and/or Healthcare Power of Attorney recommended for all adults, regardless of age or health.  Vaccines  Flu vaccine: for almost everyone, every fall.   Shingles vaccine: after age 9.   Pneumonia vaccines: after age 79  Tetanus booster: every 10 years  COVID vaccine: as soon as you're eligible! Please follow [email or web address] for updates on eligibility and vaccination appointment. As of now, we do not anticipate our clinic having vaccines anytime soon.  Cancer screenings   Colon cancer screening: follow-up as recommended per GI given history of polyps   Breast cancer screening: mammogram annually after age 29.   Cervical cancer screening: Pap not needed w/ hysterectomy.   Lung cancer screening: not needed since quit smoking!  Infection screenings  . HIV: recommended screening at least once age 31-65 . Gonorrhea/Chlamydia: screening as needed .  Hepatitis C: recommended once for everyone age 67-75 . TB: certain at-risk populations, or depending on work requirements and/or travel history Other . Bone Density Test: recommended for women at age 30        Visit summary with medication list and pertinent instructions was printed for patient to review. All questions at time of visit were answered - patient instructed to contact office with any additional concerns or updates. ER/RTC precautions were reviewed with the patient.      Please note: voice recognition software was used to produce this document, and typos may escape review. Please contact Dr. Sheppard Coil for any needed clarifications.     Follow-up plan: Return in about 1 year (around 08/04/2020) for [place] (call week prior to visit for lab orders).

## 2019-08-05 NOTE — Patient Instructions (Signed)
General Preventive Care  Most recent routine screening labs: ordered today to follow up given previously elevated sugars, elevated Depakote level.   Everyone should have blood pressure checked once per year. Goal 130/80 or less.   Tobacco: don't!   Alcohol: responsible moderation is ok for most adults - if you have concerns about your alcohol intake, please talk to me!   Exercise: as tolerated to reduce risk of cardiovascular disease and diabetes. Strength training will also prevent osteoporosis.   Mental health: if need for mental health care (medicines, counseling, other), or concerns about moods, please let me know!   Sexual health: if need for STD testing, or if concerns with libido/pain problems, please let me know!  Advanced Directive: Living Will and/or Healthcare Power of Attorney recommended for all adults, regardless of age or health.  Vaccines  Flu vaccine: for almost everyone, every fall.   Shingles vaccine: after age 26.   Pneumonia vaccines: after age 62  Tetanus booster: every 10 years  COVID vaccine: as soon as you're eligible! Please follow [email or web address] for updates on eligibility and vaccination appointment. As of now, we do not anticipate our clinic having vaccines anytime soon.  Cancer screenings   Colon cancer screening: follow-up as recommended per GI given history of polyps   Breast cancer screening: mammogram annually after age 57.   Cervical cancer screening: Pap not needed w/ hysterectomy.   Lung cancer screening: not needed since quit smoking!  Infection screenings  . HIV: recommended screening at least once age 2-65 . Gonorrhea/Chlamydia: screening as needed . Hepatitis C: recommended once for everyone age 57-75 . TB: certain at-risk populations, or depending on work requirements and/or travel history Other . Bone Density Test: recommended for women at age 43

## 2019-08-06 LAB — COMPLETE METABOLIC PANEL WITH GFR
AG Ratio: 1.7 (calc) (ref 1.0–2.5)
ALT: 16 U/L (ref 6–29)
AST: 12 U/L (ref 10–35)
Albumin: 4.5 g/dL (ref 3.6–5.1)
Alkaline phosphatase (APISO): 73 U/L (ref 37–153)
BUN: 18 mg/dL (ref 7–25)
CO2: 31 mmol/L (ref 20–32)
Calcium: 10.6 mg/dL — ABNORMAL HIGH (ref 8.6–10.4)
Chloride: 100 mmol/L (ref 98–110)
Creat: 0.7 mg/dL (ref 0.50–1.05)
GFR, Est African American: 112 mL/min/{1.73_m2} (ref >=60)
GFR, Est Non African American: 97 mL/min/{1.73_m2} (ref >=60)
Globulin: 2.7 g/dL (calc) (ref 1.9–3.7)
Glucose, Bld: 151 mg/dL — ABNORMAL HIGH (ref 65–99)
Potassium: 4.5 mmol/L (ref 3.5–5.3)
Sodium: 137 mmol/L (ref 135–146)
Total Bilirubin: 0.5 mg/dL (ref 0.2–1.2)
Total Protein: 7.2 g/dL (ref 6.1–8.1)

## 2019-08-06 LAB — CBC
HCT: 40.9 % (ref 35.0–45.0)
Hemoglobin: 13.5 g/dL (ref 11.7–15.5)
MCH: 29 pg (ref 27.0–33.0)
MCHC: 33 g/dL (ref 32.0–36.0)
MCV: 87.8 fL (ref 80.0–100.0)
MPV: 10.8 fL (ref 7.5–12.5)
Platelets: 286 10*3/uL (ref 140–400)
RBC: 4.66 10*6/uL (ref 3.80–5.10)
RDW: 14.2 % (ref 11.0–15.0)
WBC: 6.4 10*3/uL (ref 3.8–10.8)

## 2019-08-06 LAB — LIPID PANEL
Cholesterol: 215 mg/dL — ABNORMAL HIGH (ref <200)
HDL: 49 mg/dL — ABNORMAL LOW (ref >=50)
LDL Cholesterol (Calc): 133 mg/dL (calc) — ABNORMAL HIGH
Non-HDL Cholesterol (Calc): 166 mg/dL (calc) — ABNORMAL HIGH (ref <130)
Total CHOL/HDL Ratio: 4.4 (calc) (ref <5.0)
Triglycerides: 189 mg/dL — ABNORMAL HIGH (ref <150)

## 2019-08-06 LAB — VALPROIC ACID LEVEL: Valproic Acid Lvl: 81.4 mg/L (ref 50.0–100.0)

## 2019-08-06 LAB — VITAMIN D 25 HYDROXY (VIT D DEFICIENCY, FRACTURES): Vit D, 25-Hydroxy: 36 ng/mL (ref 30–100)

## 2019-08-06 LAB — HEMOGLOBIN A1C
Hgb A1c MFr Bld: 9.3 % of total Hgb — ABNORMAL HIGH (ref <5.7)
Mean Plasma Glucose: 220 (calc)
eAG (mmol/L): 12.2 (calc)

## 2019-10-21 ENCOUNTER — Encounter: Payer: Self-pay | Admitting: Osteopathic Medicine

## 2019-10-21 ENCOUNTER — Telehealth (INDEPENDENT_AMBULATORY_CARE_PROVIDER_SITE_OTHER): Payer: No Typology Code available for payment source | Admitting: Osteopathic Medicine

## 2019-10-21 VITALS — Temp 97.2°F | Wt 204.0 lb

## 2019-10-21 DIAGNOSIS — J329 Chronic sinusitis, unspecified: Secondary | ICD-10-CM | POA: Diagnosis not present

## 2019-10-21 MED ORDER — AMOXICILLIN-POT CLAVULANATE 875-125 MG PO TABS: 1.0000 | ORAL_TABLET | Freq: Two times a day (BID) | ORAL | 0 refills | Status: AC

## 2019-10-21 MED ORDER — IPRATROPIUM BROMIDE 0.06 % NA SOLN: 2.0000 | Freq: Four times a day (QID) | NASAL | 1 refills | Status: AC

## 2019-10-21 NOTE — Progress Notes (Signed)
Virtual Visit via Video (App used: MyChart) Note  I connected with      Kelsey Matthews on 10/21/19 at 10:19 AM  by a telemedicine application and verified that I am speaking with the correct person using two identifiers.  Patient is at home I am in office   I discussed the limitations of evaluation and management by telemedicine and the availability of in person appointments. The patient expressed understanding and agreed to proceed.  History of Present Illness: Kelsey Matthews is a 59 y.o. female who would like to discuss  Chief Complaint  Patient presents with  . Facial Pain  . Jaw Pain    Ongoing over a week  Feels pressure behind the eyes like sinuses bothering her  L side od nasal area feels burning that goes to ear Otalgia and jaw/dental pain on L Feels like L side is swollen  No fever measured  No difficulty swallowing and no drooling  No vision changes or hearing changes  Taken ibuprofen as needed      Observations/Objective: Temp (!) 97.2 F (36.2 C) (Oral)   Wt 204 lb (92.5 kg)   BMI 35.02 kg/m  BP Readings from Last 3 Encounters:  08/05/19 133/90  04/15/19 (!) 133/92  03/02/19 (!) 148/86   Exam: Normal Speech.  NAD  Lab and Radiology Results No results found for this or any previous visit (from the past 72 hour(s)). No results found.     Assessment and Plan: 59 y.o. female with The encounter diagnosis was Sinusitis, unspecified chronicity, unspecified location.   PDMP not reviewed this encounter. No orders of the defined types were placed in this encounter.  Meds ordered this encounter  Medications  . amoxicillin-clavulanate (AUGMENTIN) 875-125 MG tablet    Sig: Take 1 tablet by mouth 2 (two) times daily for 10 days.    Dispense:  20 tablet    Refill:  0  . ipratropium (ATROVENT) 0.06 % nasal spray    Sig: Place 2 sprays into both nostrils 4 (four) times daily. Prn rhinitis    Dispense:  15 mL    Refill:  1   Patient Instructions   Over-the-Counter Medications & Home Remedies for Upper Respiratory Illness  Note: the following list assumes no pregnancy, normal liver & kidney function and no other drug interactions. Dr. Sheppard Coil has highlighted medications which are safe for you to use, but these may not be appropriate for everyone. Always ask a pharmacist or qualified medical provider if you have any questions!   Aches/Pains, Fever, Headache Acetaminophen (Tylenol) 500 mg tablets - take max 2 tablets (1000 mg) every 6 hours (4 times per day)  Ibuprofen (Motrin) 200 mg tablets - take max 4 tablets (800 mg) every 6 hours*  Sinus Congestion Prescription Atrovent as directed Nasal Saline if desired Oxymetolazone (Afrin, others) sparing use due to rebound congestion, NEVER use in kids Phenylephrine (Sudafed) 10 mg tablets every 4 hours (or the 12-hour formulation)* Diphenhydramine (Benadryl) 25 mg tablets - take max 2 tablets every 4 hours  Cough & Sore Throat Dextromethorphan (Robitussin, others) - cough suppressant Guaifenesin (Robitussin, Mucinex, others) - expectorant (helps cough up mucus) (Dextromethorphan and Guaifenesin also come in a combination tablet) Lozenges w/ Benzocaine + Menthol (Cepacol) Honey - as much as you want! Teas which "coat the throat" - look for ingredients Elm Bark, Licorice Root, Marshmallow Root  Other Antibiotics if these are prescribed - take ALL, even if you're feeling better  Don't waste your money on  Vitamin C or Echinacea  *Caution in patients with high blood pressure      Instructions sent via MyChart. If MyChart not available, pt was given option for info via personal e-mail w/ no guarantee of protected health info over unsecured e-mail communication, and MyChart sign-up instructions were sent to patient.   Follow Up Instructions: Return if symptoms worsen or fail to improve.    I discussed the assessment and treatment plan with the patient. The patient was provided an  opportunity to ask questions and all were answered. The patient agreed with the plan and demonstrated an understanding of the instructions.   The patient was advised to call back or seek an in-person evaluation if any new concerns, if symptoms worsen or if the condition fails to improve as anticipated.  30 minutes of non-face-to-face time was provided during this encounter.      . . . . . . . . . . . . . Marland Kitchen                   Historical information moved to improve visibility of documentation.  Past Medical History:  Diagnosis Date  . Family history of adverse reaction to anesthesia 7 yrs ago   mother had to be awoken from anesthesia due to heart issue with sinus surgery  . High cholesterol   . Seizure ([place])    last seizure 2010  . Torn meniscus    right  . Vitamin D deficiency    Past Surgical History:  Procedure Laterality Date  . ABDOMINAL HYSTERECTOMY     uterus removed has ovaries  . colonscopy  2019   polyps removed  . heel spur removed Bilateral   . KNEE ARTHROSCOPY Left   . KNEE ARTHROSCOPY WITH SUBCHONDROPLASTY Right 04/15/2019   Procedure: Right knee arthroscopicpartial medial meniscectomy with medial tibial plateau subchondroplasty;  Surgeon: Nicholes Stairs, MD;  [facility]: [facility];  Service: Orthopedics;  Laterality: Right;  75 mins  . meniscus repair Left 2018  . TUBAL LIGATION     Social History   Tobacco Use  . Smoking status: Former Smoker    Packs/day: 1.00    Years: 10.00    Pack years: 10.00    Types: Cigarettes  . Smokeless tobacco: Never Used  . Tobacco comment: quit 1988  Substance Use Topics  . Alcohol use: Yes    Comment: Monthly   family history includes Bowel Disease in her father; Cancer in her mother; Diabetes in her father; Heart disease in her mother; Hypertension in her father.  Medications: Current Outpatient Medications  Medication Sig Dispense Refill  . Cholecalciferol  (VITAMIN D3) 50 MCG (2000 UT) TABS Take by mouth daily. q fridays    . divalproex (DEPAKOTE) 500 MG DR tablet Take 1 tablet (500 mg total) by mouth 2 (two) times daily. 180 tablet 3  . ibuprofen (ADVIL) 200 MG tablet Take 200 mg by mouth every 6 (six) hours as needed.    . simvastatin (ZOCOR) 10 MG tablet Take 1 tablet (10 mg total) by mouth daily. (Patient taking differently: Take 10 mg by mouth at bedtime. ) 90 tablet 3  . amoxicillin-clavulanate (AUGMENTIN) 875-125 MG tablet Take 1 tablet by mouth 2 (two) times daily for 10 days. 20 tablet 0  . ipratropium (ATROVENT) 0.06 % nasal spray Place 2 sprays into both nostrils 4 (four) times daily. Prn rhinitis 15 mL 1   No current facility-administered medications for this visit.   Allergies  Allergen Reactions  . Codeine     "takes me a while to respond"  . Prednisone Rash    rash

## 2019-10-21 NOTE — Patient Instructions (Addendum)
Over-the-Counter Medications & Home Remedies for Upper Respiratory Illness  Note: the following list assumes no pregnancy, normal liver & kidney function and no other drug interactions. Dr. Sheppard Coil has highlighted medications which are safe for you to use, but these may not be appropriate for everyone. Always ask a pharmacist or qualified medical provider if you have any questions!   Aches/Pains, Fever, Headache Acetaminophen (Tylenol) 500 mg tablets - take max 2 tablets (1000 mg) every 6 hours (4 times per day)  Ibuprofen (Motrin) 200 mg tablets - take max 4 tablets (800 mg) every 6 hours*  Sinus Congestion Prescription Atrovent as directed Nasal Saline if desired Oxymetolazone (Afrin, others) sparing use due to rebound congestion, NEVER use in kids Phenylephrine (Sudafed) 10 mg tablets every 4 hours (or the 12-hour formulation)* Diphenhydramine (Benadryl) 25 mg tablets - take max 2 tablets every 4 hours  Cough & Sore Throat Dextromethorphan (Robitussin, others) - cough suppressant Guaifenesin (Robitussin, Mucinex, others) - expectorant (helps cough up mucus) (Dextromethorphan and Guaifenesin also come in a combination tablet) Lozenges w/ Benzocaine + Menthol (Cepacol) Honey - as much as you want! Teas which "coat the throat" - look for ingredients Elm Bark, Licorice Root, Marshmallow Root  Other Antibiotics if these are prescribed - take ALL, even if you're feeling better  Don't waste your money on Vitamin C or Echinacea  *Caution in patients with high blood pressure

## 2020-02-10 ENCOUNTER — Other Ambulatory Visit: Payer: Self-pay | Admitting: Osteopathic Medicine

## 2020-02-16 ENCOUNTER — Other Ambulatory Visit: Payer: Self-pay

## 2020-02-16 MED ORDER — SIMVASTATIN 10 MG PO TABS: 10.0000 mg | ORAL_TABLET | Freq: Every day | ORAL | 1 refills | Status: DC

## 2020-03-01 ENCOUNTER — Ambulatory Visit: Payer: No Typology Code available for payment source | Admitting: Neurology

## 2020-08-05 ENCOUNTER — Other Ambulatory Visit: Payer: Self-pay | Admitting: Osteopathic Medicine

## 2021-02-07 IMAGING — MR MR KNEE*R* W/O CM
7 series · 40 of 40 positions shown · non-contrast
Comparison: Radiographs 01/26/2019.

CLINICAL DATA: Medial knee pain with mechanical knee symptoms after
lifting injury 5 months ago. No previous relevant surgery.

EXAM:
MRI OF THE RIGHT KNEE WITHOUT CONTRAST
TECHNIQUE: Multiplanar, multisequence MR imaging of the knee was performed. No
intravenous contrast was administered.

[Series 3: T2 fat-sat · axial · 4.0mm · 0.53mm/px · z∈[-73,+97]mm · 6 of 35 slices shown (1 of 3)]
[im 1/35]
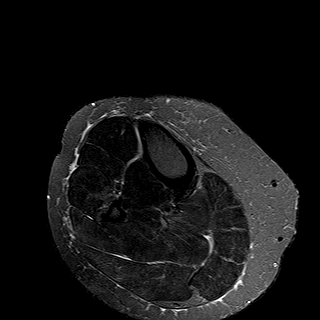
[im 7/35]
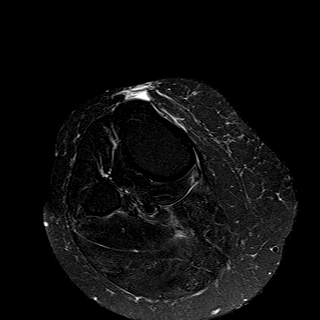
[im 14/35]
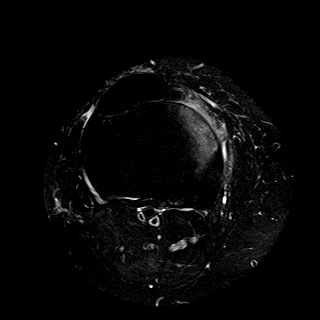
[im 21/35]
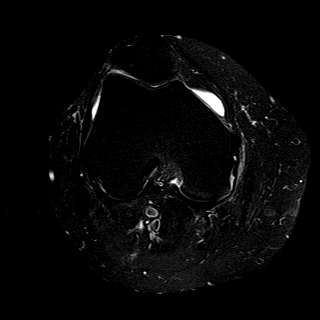
[im 28/35]
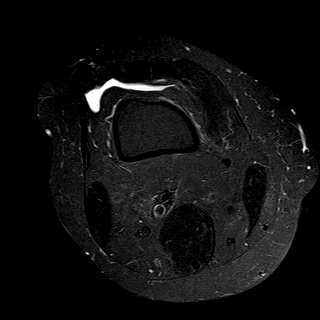
[im 35/35]
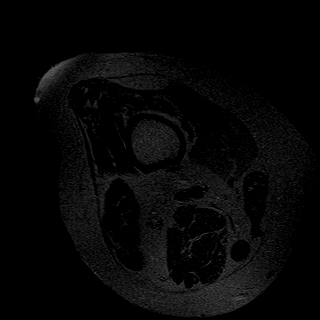

[Series 4: T1 · coronal · 4.0mm · 0.62mm/px · 5 of 31 slices shown]
[im 1/31]
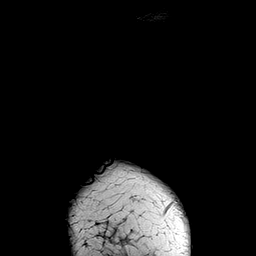
[im 8/31]
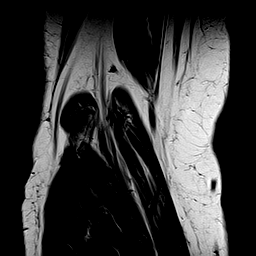
[im 16/31]
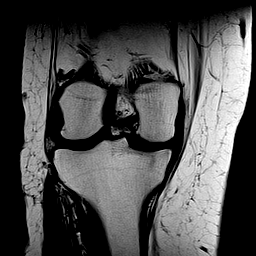
[im 23/31]
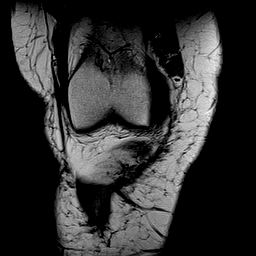
[im 31/31]
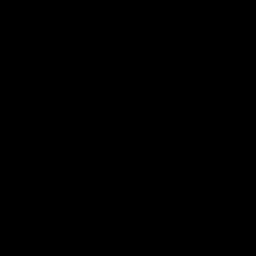

[Series 5: T2 fat-sat · coronal · 4.0mm · 0.62mm/px · 6 of 31 slices shown (2 of 3)]
[im 1/31]
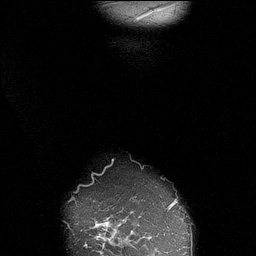
[im 7/31]
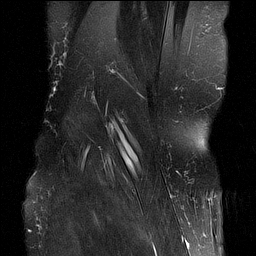
[im 13/31]
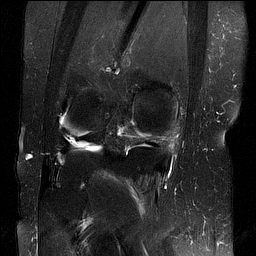
[im 19/31]
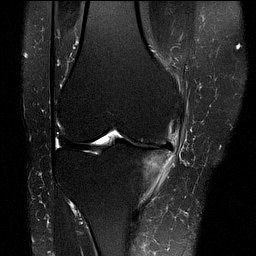
[im 25/31]
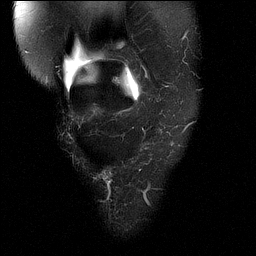
[im 31/31]
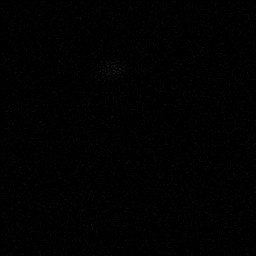

[Series 6: PD fat-sat · coronal · 4.0mm · 0.62mm/px · 6 of 31 slices shown (1 of 3)]
[im 1/31]
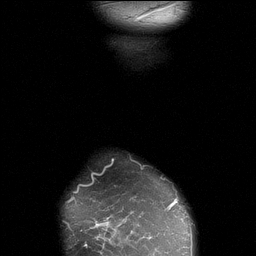
[im 7/31]
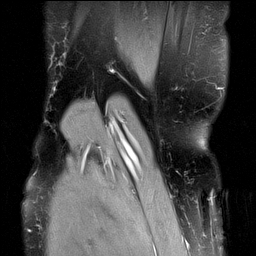
[im 13/31]
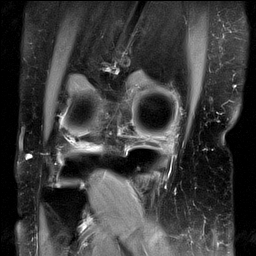
[im 19/31]
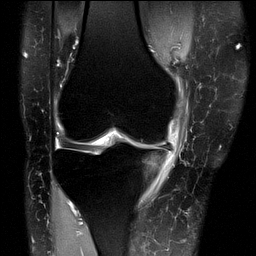
[im 25/31]
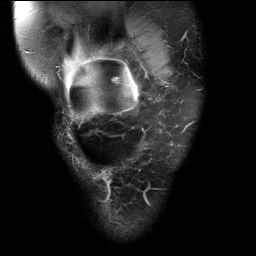
[im 31/31]
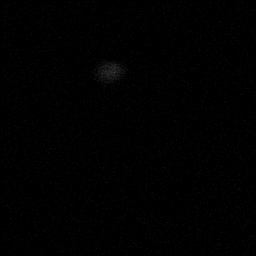

[Series 7: PD fat-sat · sagittal · 3.0mm · 0.62mm/px · 6 of 33 slices shown (2 of 3)]
[im 1/33]
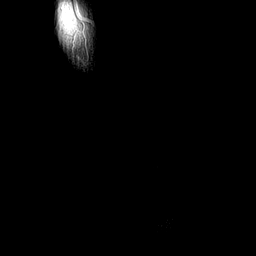
[im 7/33]
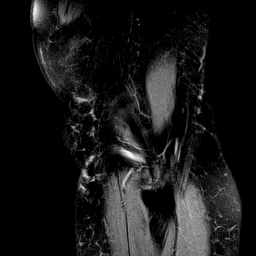
[im 13/33]
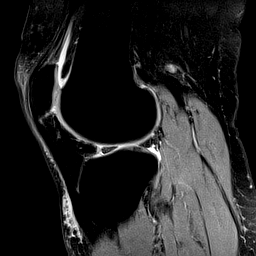
[im 20/33]
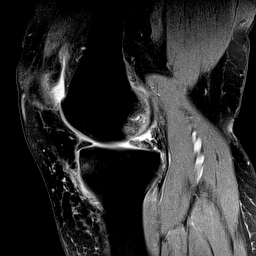
[im 26/33]
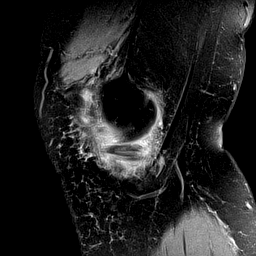
[im 33/33]
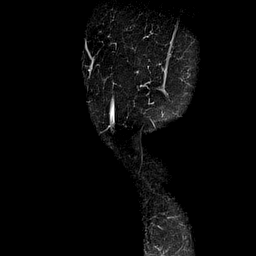

[Series 8: T2 fat-sat · sagittal · 3.0mm · 0.62mm/px · 7 of 35 slices shown (3 of 3)]
[im 1/35]
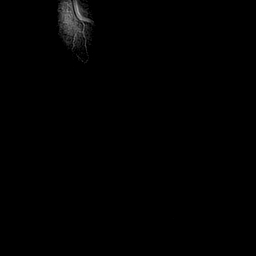
[im 6/35]
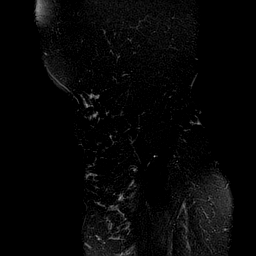
[im 12/35]
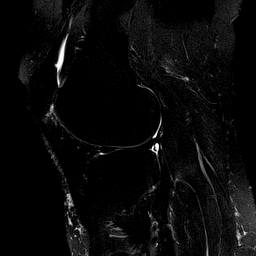
[im 18/35]
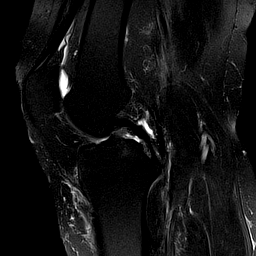
[im 23/35]
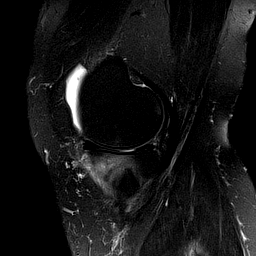
[im 29/35]
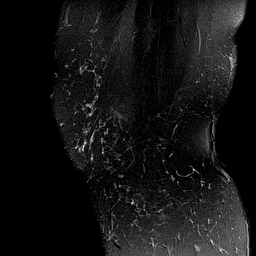
[im 35/35]
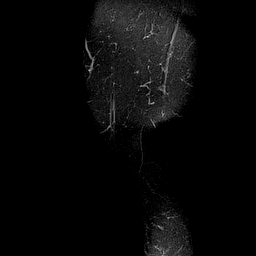

[Series 9: PD fat-sat · coronal · 2.0mm · 0.62mm/px · 4 of 19 slices shown (3 of 3)]
[im 1/19]
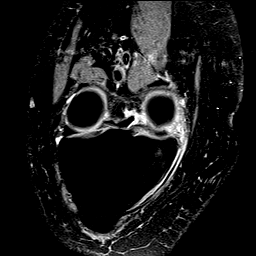
[im 7/19]
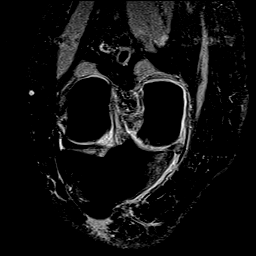
[im 13/19]
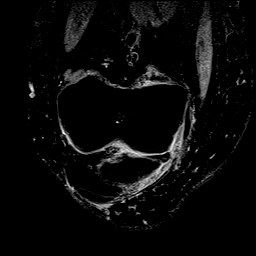
[im 19/19]
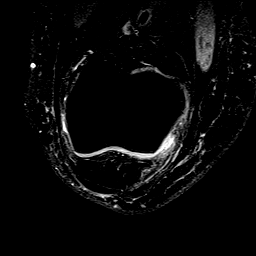

[40 of 40 positions shown; findings below may reference images not displayed]

FINDINGS: MENISCI

Medial meniscus: There is an irregular degenerative tear of the
meniscal body and posterior horn. The meniscal root is intact,
although the meniscus is partially extruded peripherally from the
joint, and there is a small flap fragment in the meniscotibial
recess. No centrally displaced meniscal fragments identified.

Lateral meniscus:  Intact with normal morphology.

LIGAMENTS

Cruciates:  Intact.

Collaterals:  Intact. There is mild medial bursal fluid.

CARTILAGE

Patellofemoral: Mild thinning of the patellar cartilage with mild
subchondral cyst formation superiorly at the patellar apex.

Medial: Moderate chondral thinning and surface irregularity without
focal defect. There is asymmetric marrow edema peripherally in the
medial tibial plateau without definite subchondral insufficiency
fracture.

Lateral:  Preserved.

MISCELLANEOUS

Joint:  Small joint effusion. No loose bodies identified.

Popliteal Fossa:  Unremarkable. No significant Baker's cyst.

Extensor Mechanism:  Intact.

Bones: As above, asymmetric marrow edema peripherally in the medial
tibial plateau without definite subchondral insufficiency fracture.
No other significant osseous findings.

Other: No other significant periarticular soft tissue findings.
IMPRESSION: 1. Irregular degenerative tear of the body and posterior horn of the
medial meniscus. There is a small flap fragment in the meniscotibial
recess.
2. Underlying moderate medial compartment degenerative chondrosis
with probable reactive edema in the medial tibial plateau.
3. The lateral meniscus, cruciate and collateral ligaments are
intact.
4. Small joint effusion.

## 2021-03-23 IMAGING — RF DG KNEE 1-2V*R*
1 series · 5 of 5 positions shown · non-contrast
Comparison: Bilateral knee radiographs 01/26/2019, knee MRI
03/02/2019

CLINICAL DATA: Fluoroscopy for right knee subchondroplasty

EXAM:
RIGHT KNEE - 1-2 VIEW

[Series 1: run · 5 of 5 slices shown]
[im 1/5]
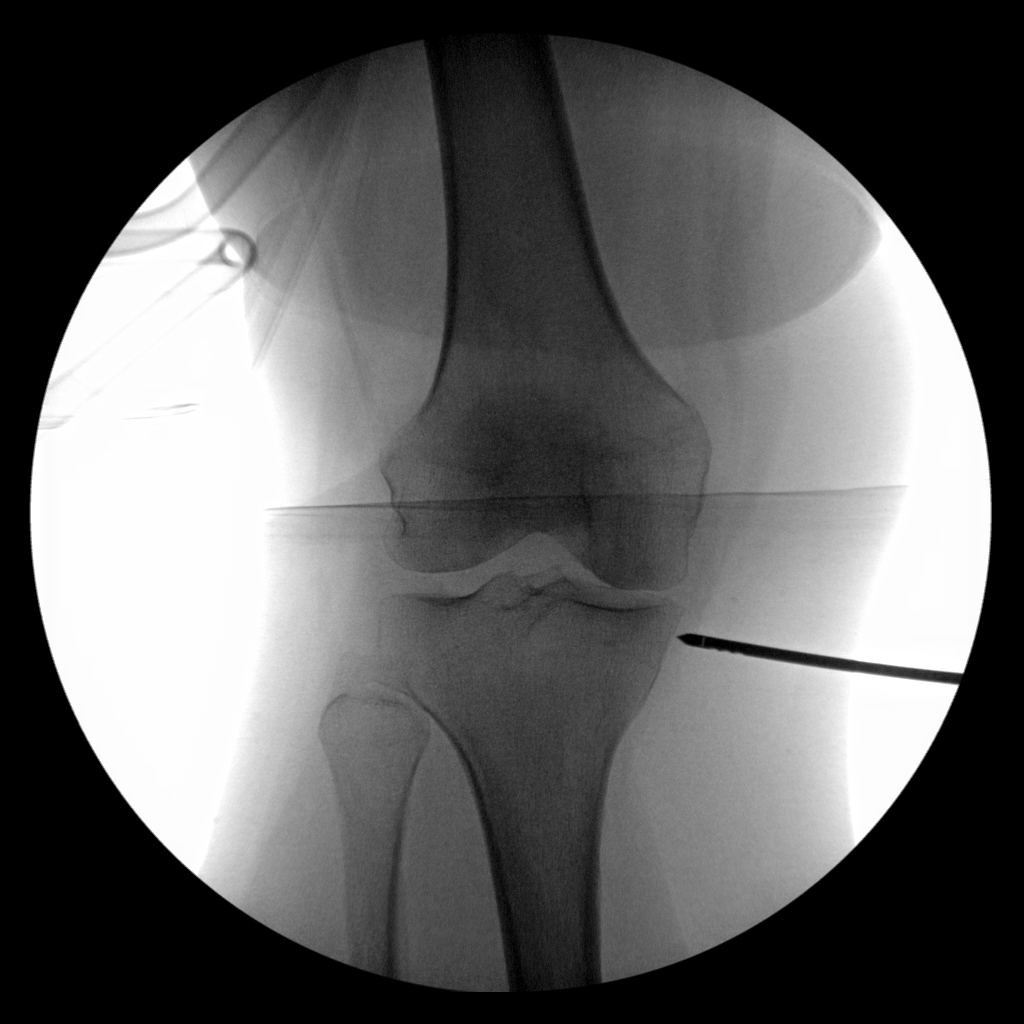
[im 2/5]
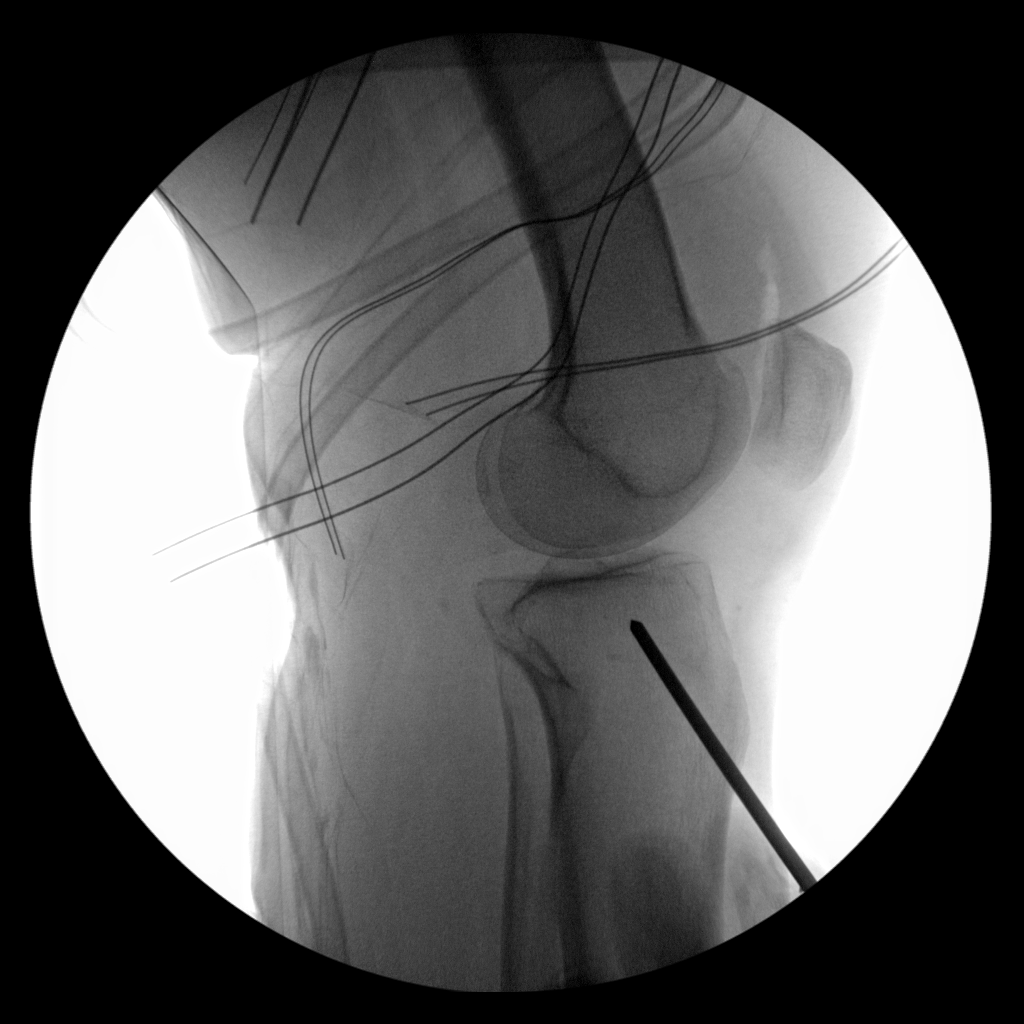
[im 3/5]
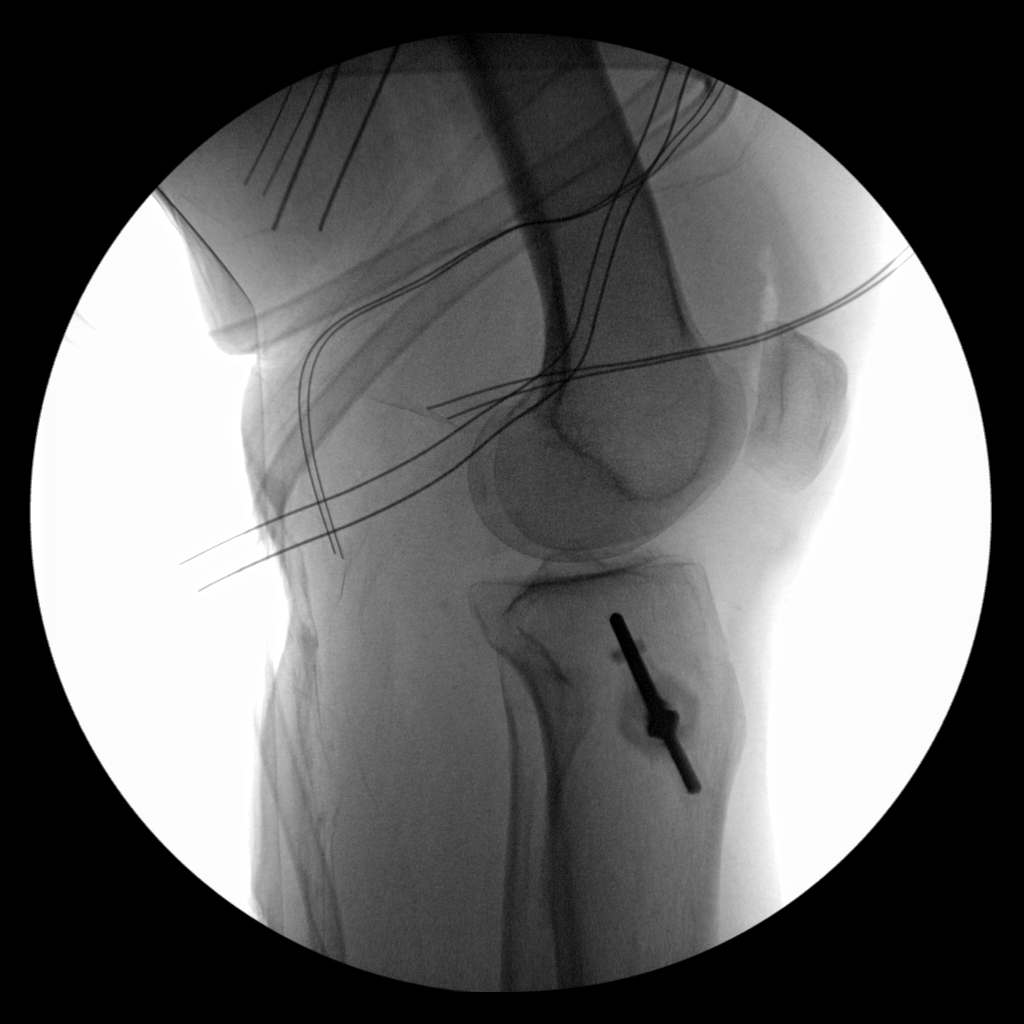
[im 4/5]
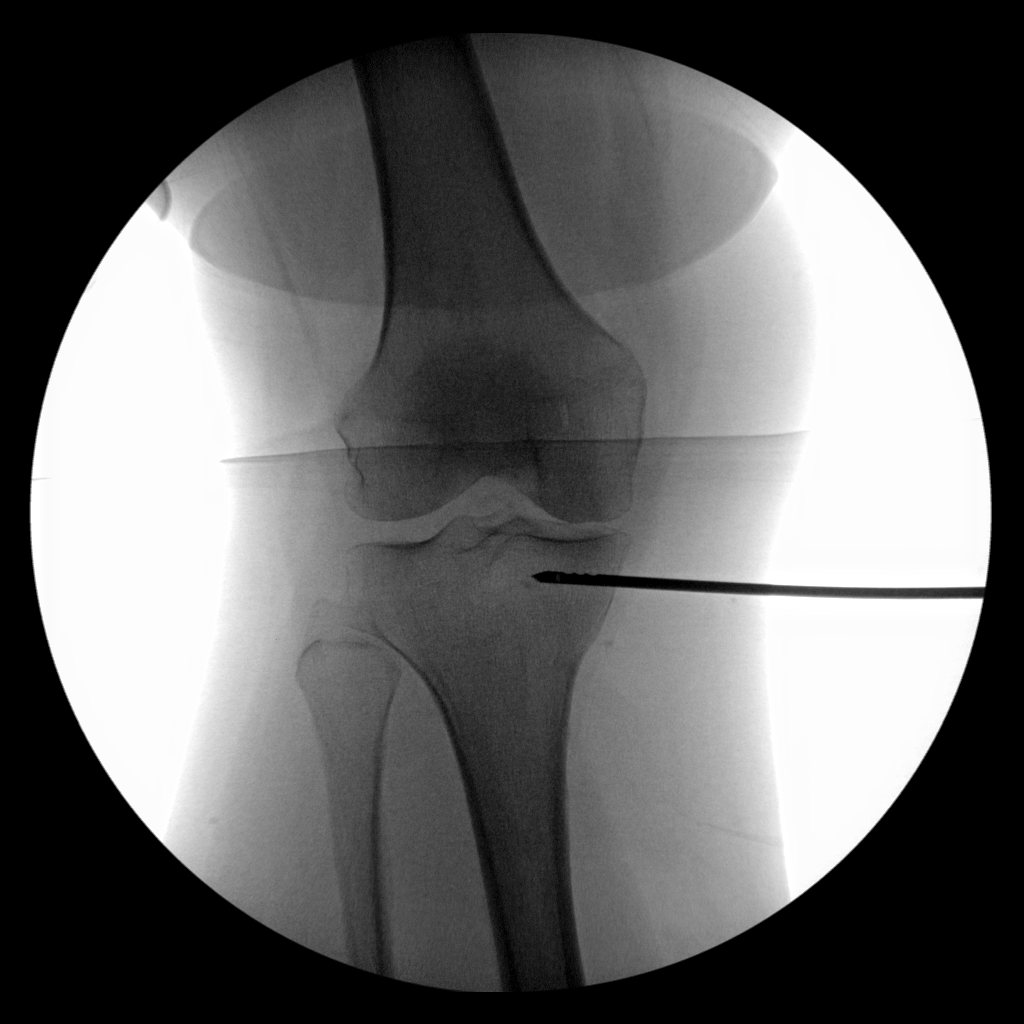
[im 5/5]
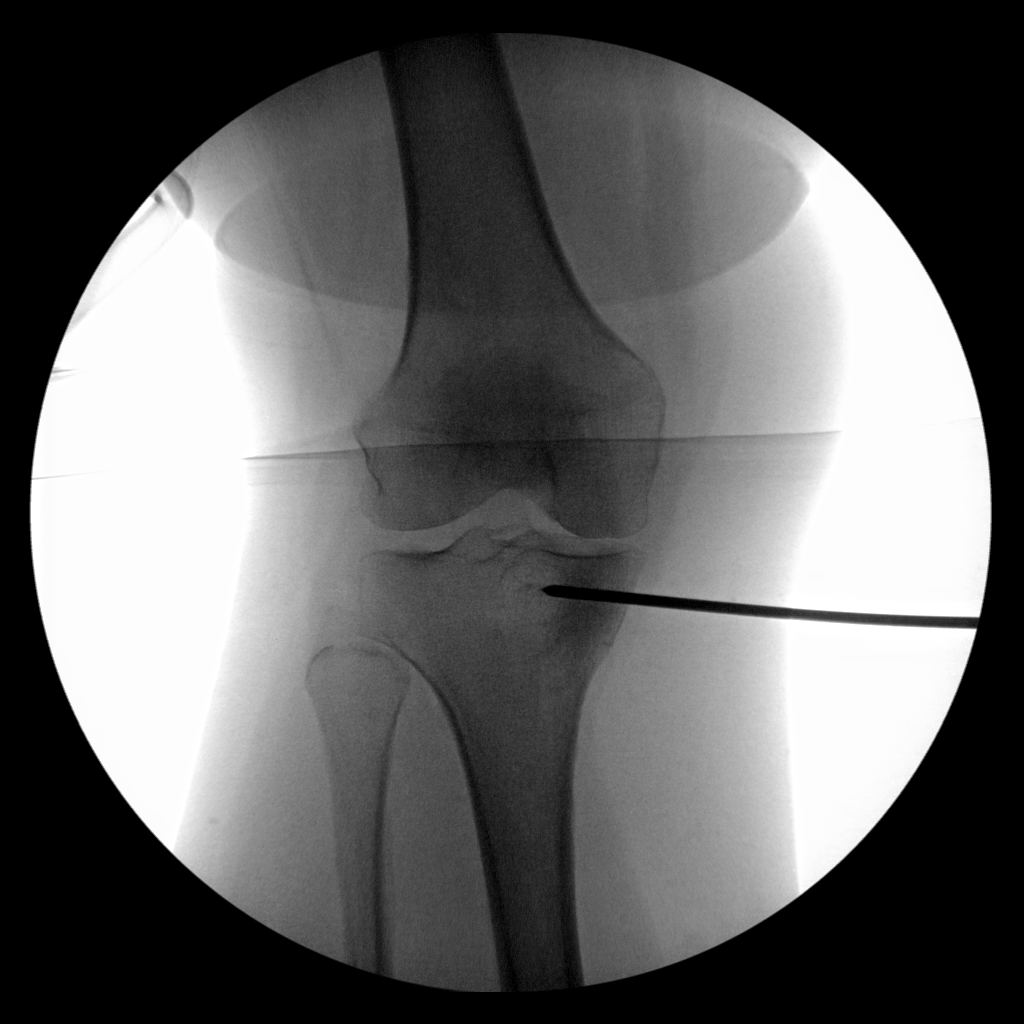

[5 of 5 positions shown; findings below may reference images not displayed]

FINDINGS: Sequential fluoroscopic images depict trocar placement in the medial
tibial plateau. No acute complication is seen. No fracture or
traumatic malalignment.
IMPRESSION: Sequential fluoroscopic images obtained during right knee
subchondroplasty. Please refer to the surgical report for further
details.
